# Patient Record
Sex: Female | Born: 1985 | Race: Black or African American | Hispanic: No | Marital: Married | Smoking: Never smoker
Health system: Southern US, Community
[De-identification: ages and names within clinical notes are randomized; demographics above are authoritative.]

## PROBLEM LIST (undated history)

## (undated) DIAGNOSIS — E0500 Thyrotoxicosis with diffuse goiter without thyrotoxic crisis or storm: Secondary | ICD-10-CM

## (undated) DIAGNOSIS — T7840XA Allergy, unspecified, initial encounter: Secondary | ICD-10-CM

## (undated) DIAGNOSIS — K219 Gastro-esophageal reflux disease without esophagitis: Secondary | ICD-10-CM

## (undated) HISTORY — DX: Allergy, unspecified, initial encounter: T78.40XA

## (undated) HISTORY — DX: Gastro-esophageal reflux disease without esophagitis: K21.9

---
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
## 2008-12-10 ENCOUNTER — Emergency Department (HOSPITAL_COMMUNITY): Admission: EM | Admit: 2008-12-10 | Discharge: 2008-12-10 | Payer: Self-pay | Admitting: Family Medicine

## 2009-02-02 ENCOUNTER — Emergency Department (HOSPITAL_COMMUNITY): Admission: EM | Admit: 2009-02-02 | Discharge: 2009-02-02 | Payer: Self-pay | Admitting: Emergency Medicine

## 2009-07-10 ENCOUNTER — Emergency Department (HOSPITAL_COMMUNITY): Admission: EM | Admit: 2009-07-10 | Discharge: 2009-07-10 | Payer: Self-pay | Admitting: Family Medicine

## 2010-07-07 ENCOUNTER — Ambulatory Visit (INDEPENDENT_AMBULATORY_CARE_PROVIDER_SITE_OTHER): Payer: PRIVATE HEALTH INSURANCE | Admitting: Physician Assistant

## 2010-07-07 ENCOUNTER — Other Ambulatory Visit (HOSPITAL_COMMUNITY)
Admission: RE | Admit: 2010-07-07 | Discharge: 2010-07-07 | Disposition: A | Discharge status: Home / Self Care | Payer: PRIVATE HEALTH INSURANCE | Source: Ambulatory Visit | Attending: Family Medicine | Admitting: Family Medicine

## 2010-07-07 ENCOUNTER — Other Ambulatory Visit: Payer: Self-pay | Admitting: Family Medicine

## 2010-07-07 DIAGNOSIS — Z124 Encounter for screening for malignant neoplasm of cervix: Secondary | ICD-10-CM | POA: Insufficient documentation

## 2010-07-07 DIAGNOSIS — B1710 Acute hepatitis C without hepatic coma: Secondary | ICD-10-CM

## 2010-07-07 DIAGNOSIS — Z0000 Encounter for general adult medical examination without abnormal findings: Secondary | ICD-10-CM

## 2010-07-07 DIAGNOSIS — J45909 Unspecified asthma, uncomplicated: Secondary | ICD-10-CM

## 2010-08-04 ENCOUNTER — Ambulatory Visit (INDEPENDENT_AMBULATORY_CARE_PROVIDER_SITE_OTHER): Payer: PRIVATE HEALTH INSURANCE | Admitting: Family Medicine

## 2010-08-04 DIAGNOSIS — J45909 Unspecified asthma, uncomplicated: Secondary | ICD-10-CM

## 2010-10-05 ENCOUNTER — Ambulatory Visit (INDEPENDENT_AMBULATORY_CARE_PROVIDER_SITE_OTHER): Payer: PRIVATE HEALTH INSURANCE | Admitting: Medical

## 2010-10-05 DIAGNOSIS — T7840XA Allergy, unspecified, initial encounter: Secondary | ICD-10-CM

## 2010-10-05 DIAGNOSIS — L509 Urticaria, unspecified: Secondary | ICD-10-CM

## 2010-10-12 ENCOUNTER — Ambulatory Visit (INDEPENDENT_AMBULATORY_CARE_PROVIDER_SITE_OTHER): Payer: PRIVATE HEALTH INSURANCE | Admitting: Medical

## 2010-10-12 ENCOUNTER — Encounter: Payer: Self-pay | Admitting: Medical

## 2010-10-12 VITALS — BP 120/88 | HR 88 | Temp 98.1°F | Ht 59.0 in | Wt 140.0 lb

## 2010-10-12 DIAGNOSIS — J45909 Unspecified asthma, uncomplicated: Secondary | ICD-10-CM

## 2010-10-12 DIAGNOSIS — L509 Urticaria, unspecified: Secondary | ICD-10-CM

## 2010-10-12 DIAGNOSIS — J40 Bronchitis, not specified as acute or chronic: Secondary | ICD-10-CM

## 2010-10-12 MED ORDER — LEVOCETIRIZINE DIHYDROCHLORIDE 5 MG PO TABS: 5.0000 mg | ORAL_TABLET | Freq: Every evening | ORAL | Status: AC

## 2010-10-12 MED ORDER — AZITHROMYCIN 500 MG PO TABS: 500.0000 mg | ORAL_TABLET | Freq: Every day | ORAL | Status: AC

## 2010-10-12 MED ORDER — FLUTICASONE PROPIONATE HFA 110 MCG/ACT IN AERO: 2.0000 | INHALATION_SPRAY | Freq: Two times a day (BID) | RESPIRATORY_TRACT | Status: DC

## 2010-10-12 MED ORDER — TRIAMCINOLONE ACETONIDE 0.1 % EX CREA: TOPICAL_CREAM | Freq: Two times a day (BID) | CUTANEOUS | Status: DC

## 2010-10-12 NOTE — Patient Instructions (Signed)
Hives Hives (urticaria) are itchy, red, swollen patches on the skin. They may change size, shape, and location quickly and repeatedly. Hives that occur deeper in the skin can cause swelling of the hands, feet, and face. Hives may be an allergic reaction to something you ate, touched, or put on your skin. Hives can also be a reaction to cold, heat, viral infections, medication, insect bites, or emotional stress. Often the cause is hard to find. Hives can come and go for several days to several weeks. Hives are not contagious. HOME CARE INSTRUCTIONS  If the cause of the hives is known, avoid exposure to that source.   To relieve itching and rash:   Apply cold compresses to the skin or take cool water baths. Do not take hot baths or showers because the warmth will make the itching worse.   The best medicine for hives is an antihistamine. An antihistamine will not cure hives, but it will reduce their severity. You can use an antihistamine available over the counter, such as Benadryl. This medicine may make you sleepy. You should not drive while using this medicine.   Other medications may be prescribed for itching. Use these as directed.   You should wear loose fitting clothing, including undergarments, as skin irritations may make hives worse.   Follow-up as directed by your caregiver.  SEEK MEDICAL CARE IF:  You still have considerable itching after taking the medication (prescribed or purchased over the counter).   An oral temperature above 101 develops.   Joint swelling or pain occurs.   You are not improving or are getting worse.  SEEK IMMEDIATE MEDICAL CARE IF:  You notice any swelling of the lips, tongue, face or neck.   You have shortness of breath, difficulty breathing or swallowing, or tightness in the throat or chest.   Belly (abdominal) pain develops.   You are feeling very sick.   Your hives continue to spread.  These may be the first signs of a life-threatening  allergic reaction. THIS IS AN EMERGENCY. Call 911 for medical help. Document Released: 06/29/2004 Document Re-Released: 08/16/2009 [facility] Patient Information 2011 [place], [place].

## 2010-10-12 NOTE — Progress Notes (Signed)
  Subjective:    Patient ID: Helen Cameron, female    DOB: [birth year], 25 y.o.   MRN: [id number]  HPI Here for recheck.  Saw me last week for rash/hives, was given IM Depo Medrol in house.  She has also been taking her Singulair, OTC Benadryl, topical hydrocortisone, but the rash comes and goes.  She notes having rash 3-4 days after shot.  No rash yesterday.  Overall feels much better than she was.  She also notes cough, congestion, productive sputum.  Lately having to use rescue inhaler 3-4 times per day.  Was on Flovent prior, months ago, but had stopped this.   No other c/o.   Review of Systems  Constitutional: Positive for fatigue. Negative for fever, chills and unexpected weight change.  HENT: Positive for congestion. Negative for ear pain, sore throat, rhinorrhea, sneezing, neck pain, sinus pressure and ear discharge.   Eyes: Negative for discharge and itching.  Respiratory: Positive for cough, chest tightness, shortness of breath and wheezing. Negative for stridor.   Cardiovascular: Negative for chest pain and leg swelling.  Gastrointestinal: Negative for nausea, vomiting, abdominal pain and diarrhea.  Genitourinary: Negative for dysuria.  Musculoskeletal: Negative for myalgias and arthralgias.  Skin: Positive for rash.       Objective:   Physical Exam  Constitutional: She appears well-developed and well-nourished. No distress.  HENT:  Head: Normocephalic.  Right Ear: Tympanic membrane normal.  Left Ear: Tympanic membrane normal.  Nose: Mucosal edema and rhinorrhea present. No sinus tenderness. Right sinus exhibits no maxillary sinus tenderness and no frontal sinus tenderness. Left sinus exhibits no maxillary sinus tenderness and no frontal sinus tenderness.  Mouth/Throat: Oropharynx is clear and moist and mucous membranes are normal. No posterior oropharyngeal erythema.  Eyes: Conjunctivae are normal. Right eye exhibits no discharge. Left eye exhibits no discharge.  Neck:  Neck supple. No thyromegaly present.  Cardiovascular: Normal rate, regular rhythm, normal heart sounds and intact distal pulses.   No murmur heard. Pulmonary/Chest: Effort normal. No stridor. She has no wheezes. She has no rales.       Somewhat bronchial sounding, decreased breath sounds  Musculoskeletal: She exhibits no edema.  Lymphadenopathy:    She has no cervical adenopathy.  Skin: Skin is warm and dry. Rash noted.       Few small areas of whealed lesions, left upper arm, right lateral arm, neck, otherwise much improved from last visit          Assessment & Plan:   Encounter Diagnoses  Name Primary?  . Bronchitis Yes  . Asthma   . Urticaria    Bronchitis - Zpak, rest, hydrate well, OTC Mucinex DM.  Call if not improving.  Asthma - c/t rescue inhaler prn, but we added back Flovent inhaler today.   Urticaria - avoid triggers, possibly the recent strawberries.  Hydroxyzine prescribed today,   Call report in 78mo.

## 2010-11-24 ENCOUNTER — Encounter: Payer: Self-pay | Admitting: Family Medicine

## 2010-11-24 ENCOUNTER — Ambulatory Visit (INDEPENDENT_AMBULATORY_CARE_PROVIDER_SITE_OTHER): Payer: PRIVATE HEALTH INSURANCE | Admitting: Family Medicine

## 2010-11-24 VITALS — BP 128/88 | HR 84 | Ht 59.0 in | Wt 141.0 lb

## 2010-11-24 DIAGNOSIS — J45909 Unspecified asthma, uncomplicated: Secondary | ICD-10-CM | POA: Insufficient documentation

## 2010-11-24 DIAGNOSIS — N390 Urinary tract infection, site not specified: Secondary | ICD-10-CM

## 2010-11-24 DIAGNOSIS — R109 Unspecified abdominal pain: Secondary | ICD-10-CM

## 2010-11-24 LAB — POCT URINALYSIS DIPSTICK
Bilirubin, UA: NEGATIVE
Blood, UA: NEGATIVE
Glucose, UA: NEGATIVE
Spec Grav, UA: 1.02

## 2010-11-24 NOTE — Progress Notes (Signed)
SUBJECTIVE:  Patient presents with lower abdominal pain (described as bladder pain) for about a week.  Yesterday the pain was worse, she drank a lot of cranberry juice, which seemed to help.  Pain is back today. Denies any dysuria.  +increase in urinary frequency, some urgency.    Denies any vaginal discharge, odor, itch (just skin after shaving). She is married, and monogamous.  Past Medical History  Diagnosis Date  . Allergy   . Asthma   . GERD (gastroesophageal reflux disease)     History reviewed. No pertinent past surgical history.  History   Social History  . Marital Status: Single    Spouse Name: N/A    Number of Children: N/A  . Years of Education: N/A   Occupational History  . Not on file.   Social History Main Topics  . Smoking status: Never Smoker   . Smokeless tobacco: Not on file  . Alcohol Use: Yes     few times a year, special occasions.  . Drug Use: No  . Sexually Active: Not on file   Other Topics Concern  . Not on file   Social History Narrative  . No narrative on file    Family History  Problem Relation Age of Onset  . Fibroids Sister   . Asthma Neg Hx   . Heart disease Neg Hx     Current outpatient prescriptions:albuterol (PROVENTIL,VENTOLIN) 90 MCG/ACT inhaler, Inhale 2 puffs into the lungs every 6 (six) hours as needed.  , Disp: , Rfl: ;  fluticasone (FLOVENT HFA) 110 MCG/ACT inhaler, Inhale 2 puffs into the lungs 2 (two) times daily., Disp: 1 Inhaler, Rfl: 3;  levocetirizine (XYZAL) 5 MG tablet, Take 1 tablet (5 mg total) by mouth every evening., Disp: 30 tablet, Rfl: 5 montelukast (SINGULAIR) 10 MG tablet, Take 10 mg by mouth at bedtime.  , Disp: , Rfl: ;  triamcinolone (KENALOG) 0.1 % cream, Apply topically 2 (two) times daily., Disp: 30 g, Rfl: 0  Allergies  Allergen Reactions  . Omeprazole     REACTION: Rash   ROS: Asthma has been well controlled, no active allergies.  No fever, nausea, vomiting, diarrhea, vaginal discharge.  See  HPI  PHYSICAL EXAM: BP 128/88  Pulse 84  Ht 4\' 11"  (1.499 m)  Wt 141 lb (63.957 kg)  BMI 28.48 kg/m2  LMP 11/04/2010 Well developed, pleasant overweight female in no distress Back: no CVA tenderness Abdomen:  Mild suprapubic tenderness, slightly tender in both L and R lower quadrants.  No rebound tenderness or guarding Pelvic exam:  Normal external genitalia.  Tender at R adnexa, no mass.  L adnexa nontender.  No cervical motion tenderness, uterus normal  Urine dipstick --entirely normal  ASSESSMENT/PLAN:  1. Urinary tract infection, site not specified  POCT urinalysis dipstick, Urine Culture  2. Abdominal  pain, other specified site    3. Abdominal pain  POCT urine pregnancy   No evidence of UTI per u/a.  Will send for culture to ensure no infection.  Pregnancy test was negative, but she is not late on her cycle yet.  Given tenderness of R adnexa, most likely diagnosis is ovarian cyst.  Recommend use of NSAIDS (ie ibuprofen 600-800mg  TID with food prn pain).  F/u if increasing pain, fever, vaginal discharge, persistent or worsening symptoms.  May have some component of constipation--encouraged adequate fluid and fiber intake.  Asthma--reminded to restart Singulair once allergies/wheezes recur. Continue Flovent

## 2010-11-24 NOTE — Patient Instructions (Addendum)
I SUSPECT YOUR PAIN IS FROM A POSSIBLE OVARIAN CYST.  ANTI-INFLAMMATORIES SHOULD HELP WITH THE DISCOMFORT.  PLEASE SEEK MEDICAL CARE IF YOU DEVELOP FEVERS, WORSENING PELVIC PAIN, OR VAGINAL DISCHARGE.  FOLLOW UP IF SYMPTOMS PERSIST OR WORSEN.  WE ARE SENDING YOUR URINE FOR CULTURE AND WILL NOTIFY YOU IF YOU HAVE AN INFECTION    Ovarian Cyst The ovaries are small organs that are on each side of the uterus. The ovaries are the organs that produce the female hormones, estrogen and progesterone. An ovarian cyst is a sac filled with fluid that can vary in its size. It is normal for a small cyst to form in women who are in the childbearing age and who have menstrual periods. This type of cyst is called a follicle cyst that becomes an ovulation cyst (corpus luteum cyst) after it produces the women's egg. It later goes away on its own if the woman does not become pregnant. There are other kinds of ovarian cysts that may cause problems and may need to be treated. The most serious problem is a cyst with cancer. It should be noted that menopausal women who have an ovarian cyst are at a higher risk of it being a cancer cyst. They should be evaluated very quickly, thoroughly and followed closely. This is especially true in menopausal women because of the high rate of ovarian cancer in women in menopause. CAUSES AND TYPES OF OVARIAN CYSTS:  FUNCTIONAL CYST: The follicle/corpus luteum cyst is a functional cyst that occurs every month during ovulation with the menstrual cycle. They go away with the next menstrual cycle if the woman does not get pregnant. Usually, there are no symptoms with a functional cyst.   ENDOMETRIOMA CYST: This cyst develops from the lining of the uterus tissue. This cyst gets in or on the ovary. It grows every month from the bleeding during the menstrual period. It is also called a "chocolate cyst" because it becomes filled with blood that turns brown. This cyst can cause pain in the lower abdomen  during intercourse and with your menstrual period.   CYSTADENOMA CYST: This cyst develops from the cells on the outside of the ovary. They usually are not cancerous. They can get very big and cause lower abdomen pain and pain with intercourse. This type of cyst can twist on itself, cut off its blood supply and cause severe pain. It also can easily rupture and cause a lot of pain.   DERMOID CYST: This type of cyst is sometimes found in both ovaries. They are found to have different kinds of body tissue in the cyst. The tissue includes skin, teeth, hair, and/or cartilage. They usually do not have symptoms unless they get very big. Dermoid cysts are rarely cancerous.   POLYCYSTIC OVARY: This is a rare condition with hormone problems that produces many small cysts on both ovaries. The cysts are follicle-like cysts that never produce an egg and become a corpus luteum. It can cause an increase in body weight, infertility, acne, increase in body and facial hair and lack of menstrual periods or rare menstrual periods. Many women with this problem develop type 2 diabetes. The exact cause of this problem is unknown. A polycystic ovary is rarely cancerous.   THECA LUTEIN CYST: Occurs when too much hormone (human chorionic gonadotropin) is produced and over-stimulates the ovaries to produce an egg. They are frequently seen when doctors stimulate the ovaries for invitro-fertilization (test tube babies).   LUTEOMA CYST: This cyst is seen during  pregnancy. Rarely it can cause an obstruction to the birth canal during labor and delivery. They usually go away after delivery.  SYMPTOMS  Pelvic pain or pressure.   Pain during sexual intercourse.   Increasing girth (swelling) of the abdomen.   Abnormal menstrual periods.   Increasing pain with menstrual periods.   You stop having menstrual periods and you are not pregnant.  DIAGNOSIS The diagnosis can be made during:  Routine or annual pelvic examination  (common).   Ultrasound.   X-ray of the pelvis.   CT Scan.   MRI.   Blood tests.  TREATMENT  Treatment may only be to follow the cyst monthly for 2 to 3 months with your caregiver. Many go away on their own, especially functional cysts.   May be aspirated (drained) with a long needle with ultrasound, or by laparoscopy (inserting a tube into the pelvis through a small incision).   The whole cyst can be removed by laparoscopy.   Sometimes the cyst may need to be removed through an incision in the lower abdomen.   Hormone treatment is sometimes used to help dissolve certain cysts.   Birth control pills are sometimes used to help dissolve certain cysts.  HOME CARE INSTRUCTIONS Follow your caregiver's advice regarding:  Medicine.   Follow up visits to evaluate and treat the cyst.   You may need to come back or make an appointment with another caregiver, to find the exact cause of your cyst, if your caregiver is not a gynecologist.   Get your yearly and recommended pelvic examinations and Pap tests.   Let your caregiver know if you have had an ovarian cyst in the past.  SEEK MEDICAL CARE IF:  Your periods are late, irregular, they stop, or are painful.   Your stomach (abdomen) or pelvic pain does not go away.   Your stomach becomes larger or swollen.   You have pressure on your bladder or trouble emptying your bladder completely.   You have painful sexual intercourse.   You have feelings of fullness, pressure, or discomfort in your stomach.   You lose weight for no apparent reason.   You feel generally ill.   You become constipated.   You lose your appetite.   You develop acne.   You have an increase in body and facial hair.   You are gaining weight, without changing your exercise and eating habits.   You think you are pregnant.  SEEK IMMEDIATE MEDICAL CARE IF:  You have increasing abdominal pain.   You feel sick to your stomach (nausea) and/or vomit.    You develop a fever that comes on suddenly.   You develop abdominal pain during a bowel movement.   Your menstrual periods become heavier than usual.  Document Released: 05/22/2005 Document Re-Released: 05/10/2009 [facility] Patient Information 2011 [place], [place].

## 2010-11-26 LAB — URINE CULTURE: Colony Count: >=100000

## 2010-11-28 ENCOUNTER — Telehealth: Payer: Self-pay | Admitting: *Deleted

## 2010-11-28 NOTE — Telephone Encounter (Signed)
Spoke with patient, she stated she is feeling better than when she came in to see you. She did, however just start her cycle. She stated that once her cycle stopped if she was still having any symptoms she would come in for a lab visit(clean catch UA and culture).

## 2011-02-02 ENCOUNTER — Ambulatory Visit: Payer: PRIVATE HEALTH INSURANCE | Admitting: Family Medicine

## 2011-02-21 ENCOUNTER — Ambulatory Visit (INDEPENDENT_AMBULATORY_CARE_PROVIDER_SITE_OTHER)
Admission: RE | Admit: 2011-02-21 | Discharge: 2011-02-21 | Disposition: A | Discharge status: Home / Self Care | Payer: BC Managed Care – PPO | Source: Ambulatory Visit | Attending: Emergency Medicine | Admitting: Emergency Medicine

## 2011-02-21 ENCOUNTER — Other Ambulatory Visit: Payer: Self-pay

## 2011-02-21 ENCOUNTER — Emergency Department (HOSPITAL_BASED_OUTPATIENT_CLINIC_OR_DEPARTMENT_OTHER)
Admission: EM | Admit: 2011-02-21 | Discharge: 2011-02-21 | Disposition: A | Discharge status: Home / Self Care | Payer: BC Managed Care – PPO | Attending: Emergency Medicine | Admitting: Emergency Medicine

## 2011-02-21 DIAGNOSIS — R0609 Other forms of dyspnea: Secondary | ICD-10-CM | POA: Insufficient documentation

## 2011-02-21 DIAGNOSIS — R079 Chest pain, unspecified: Secondary | ICD-10-CM

## 2011-02-21 DIAGNOSIS — R0989 Other specified symptoms and signs involving the circulatory and respiratory systems: Secondary | ICD-10-CM | POA: Insufficient documentation

## 2011-02-21 DIAGNOSIS — J45909 Unspecified asthma, uncomplicated: Secondary | ICD-10-CM | POA: Insufficient documentation

## 2011-02-21 MED ORDER — ACETAMINOPHEN 325 MG PO TABS: 650.0000 mg | ORAL_TABLET | Freq: Once | ORAL | Status: DC

## 2011-02-21 NOTE — ED Notes (Signed)
Pt states she will take Tylenol when she gets home. Medication not given here prior to d/c

## 2011-02-22 ENCOUNTER — Encounter: Payer: Self-pay | Admitting: Family Medicine

## 2011-02-22 ENCOUNTER — Ambulatory Visit (INDEPENDENT_AMBULATORY_CARE_PROVIDER_SITE_OTHER): Payer: BC Managed Care – PPO | Admitting: Family Medicine

## 2011-02-22 ENCOUNTER — Ambulatory Visit: Payer: BC Managed Care – PPO | Admitting: Family Medicine

## 2011-02-22 VITALS — BP 132/78 | HR 72 | Ht 59.0 in | Wt 144.0 lb

## 2011-02-22 DIAGNOSIS — K219 Gastro-esophageal reflux disease without esophagitis: Secondary | ICD-10-CM

## 2011-02-22 DIAGNOSIS — Z23 Encounter for immunization: Secondary | ICD-10-CM

## 2011-02-22 DIAGNOSIS — J45909 Unspecified asthma, uncomplicated: Secondary | ICD-10-CM

## 2011-02-22 MED ORDER — DEXLANSOPRAZOLE 60 MG PO CPDR: 60.0000 mg | DELAYED_RELEASE_CAPSULE | Freq: Every day | ORAL | Status: DC

## 2011-02-22 MED ORDER — MONTELUKAST SODIUM 10 MG PO TABS
10.0000 mg | ORAL_TABLET | Freq: Every day | ORAL | Status: DC
Start: 2011-02-22 — End: 2012-01-03

## 2011-02-22 NOTE — Progress Notes (Signed)
Went to ER yesterday with chest pain.  Had EKG, bloodwork and chest x-ray and was told everything was normal.  She was told she had reflux and to f/u with her primary doctor.  Was not given any prescription medication. (Computers were down--no records available). She has a h/o reflux in the past  Has been having problems with acid reflux for about a year.  Chest pain started about a week ago. Has been drinking more milk and taking Tums, and the milk seems to help with her reflux some.  Has been having some spicy foods, and has been drinking tea daily for the last 2 weeks.  Hasn't had alcohol recently.  Denies eating citrus foods, but has had some red sauces.  Allergy to prilosec, causes rash  Past Medical History  Diagnosis Date  . Allergy   . Asthma   . GERD (gastroesophageal reflux disease)     History reviewed. No pertinent past surgical history.  History   Social History  . Marital Status: Single    Spouse Name: N/A    Number of Children: N/A  . Years of Education: N/A   Occupational History  . student at [employer]    Social History Main Topics  . Smoking status: Never Smoker   . Smokeless tobacco: Not on file  . Alcohol Use: Yes     few times a year, special occasions.  . Drug Use: No  . Sexually Active: Not on file   Other Topics Concern  . Not on file   Social History Narrative  . No narrative on file    Family History  Problem Relation Age of Onset  . Fibroids Sister   . Asthma Neg Hx   . Heart disease Neg Hx     Current outpatient prescriptions:albuterol (PROVENTIL,VENTOLIN) 90 MCG/ACT inhaler, Inhale 2 puffs into the lungs every 6 (six) hours as needed.  , Disp: , Rfl: ;  fluticasone (FLOVENT HFA) 110 MCG/ACT inhaler, Inhale 2 puffs into the lungs 2 (two) times daily., Disp: 1 Inhaler, Rfl: 3;  montelukast (SINGULAIR) 10 MG tablet, Take 1 tablet (10 mg total) by mouth at bedtime., Disp: 30 tablet, Rfl: 5 dexlansoprazole (DEXILANT) 60 MG capsule, Take 1 capsule (60  mg total) by mouth daily., Disp: 20 capsule, Rfl: 0;  levocetirizine (XYZAL) 5 MG tablet, Take 1 tablet (5 mg total) by mouth every evening., Disp: 30 tablet, Rfl: 5;  triamcinolone (KENALOG) 0.1 % cream, Apply topically 2 (two) times daily., Disp: 30 g, Rfl: 0  Allergies  Allergen Reactions  . Omeprazole     REACTION: Rash   ROS: Denies fevers, URI symptoms, SOB, stool changes.  +abdominal pain, heartburn  PHYSICAL EXAM: BP 132/78  Pulse 72  Ht 4\' 11"  (1.499 m)  Wt 144 lb (65.318 kg)  BMI 29.08 kg/m2  LMP 02/12/2011 Well developed, pleasant female in no distress Neck: no lymphadenopathy or mass Heart: regular rate and rhythm without murmur Lungs clear bilaterally Abdomen: +Epigastric tenderness, diffuse lower abdominal tenderness.  No organomegaly, rebound tenderness or guarding, normal bowel sounds Extremities: no edema Skin: no rash  ASSESSMENT/PLAN: 1. GERD (gastroesophageal reflux disease)  dexlansoprazole (DEXILANT) 60 MG capsule  2. Need for prophylactic vaccination and inoculation against influenza  Flu vaccine greater than or equal to 3yo preservative free IM  3. Asthma  montelukast (SINGULAIR) 10 MG tablet   GERD--reflux precautions reviewed.  Samples of Dexilant (h/o rash to prilosec-if rash develops then stop immediately).  Otherwise take for 2-3 weeks, change diet,  and consider changing over to OTC Pepcid or Zantac.  If these aren't effective, then call for rx of PPI  Asthma--doing well.  Refilled Singulair and flu shot given

## 2011-02-22 NOTE — Patient Instructions (Signed)
Esophagitis (Heartburn) Esophagitis (heartburn) is a painful, burning sensation in the chest. It may feel worse in certain positions, such as lying down or bending over. It is caused by stomach acid backing up into the tube that carries food from the mouth down to the stomach (lower esophagus). TREATMENT There are a number of non-prescription medicines used to treat heartburn, including:  Antacids.   Acid reducers (also called H-2 blockers).   Proton-pump inhibitors.  HOME CARE INSTRUCTIONS  Raise the head of your bead by putting blocks under the legs.   Eat 2-3 hours before going to bed.   Stop smoking.   Try to reach and maintain a healthy weight.   Do not eat just a few very large meals. Instead, eat many smaller meals throughout the day.   Try to identify foods and beverages that make your symptoms worse, and avoid these.   Avoid tight clothing.   Do not exercise right after eating.  SEEK IMMEDIATE MEDICAL CARE IF YOU:  Have severe chest pain that goes down your arm, or into your jaw or neck.   Feel sweaty, dizzy, or lightheaded.   Are short of breath.   Throw up (vomit) blood.   Have difficulty or pain with swallowing.   Have bloody or black, tarry stools.   Have bouts of heartburn more than three times a week for more than two weeks.  Document Released: 06/29/2004 Document Re-Released: 08/16/2009 [employer] Patient Information 2011 [employer]  YOU MAY TRY GAS-X FOR THE LOWER ABDOMINAL PAINS YOU ARE HAVING ONCE YOU HAVE COMPLETED THE SAMPLES GIVEN, YOU CAN TRY CHANGING TO OVER-THE-COUNTER ZANTAC OR PEPCID.  IF THESE AREN'T AS EFFECTIVE AS THE SAMPLES, THEN CALL FOR PRESCRIPTION OF THE SAMPLE MEDICATION  F/U 3 WEEKS, SOONER IF GETTING WORSE

## 2011-03-01 NOTE — ED Provider Notes (Signed)
History     CSN: [id number] Arrival date & time: 02/21/2011 11:43 AM  No chief complaint on file.   (Consider location/radiation/quality/duration/timing/severity/associated sxs/prior treatment) Patient is a 25 y.o. female presenting with chest pain. The history is provided by the patient. No language interpreter was used.  Chest Pain The chest pain began more than 2 weeks ago. Chest pain occurs constantly. The chest pain is unchanged. The pain is associated with breathing and coughing. At its most intense, the pain is at 6/10. The pain is currently at 3/10. The quality of the pain is described as aching and tightness. The pain does not radiate. Chest pain is worsened by deep breathing. Primary symptoms include shortness of breath and cough. She tried NSAIDs for the symptoms.    Chest Pain  The current episode started more than 2 weeks ago. The problem occurs constantly. The problem has been unchanged. The quality of the pain is described as aching and tightness. The pain does not radiate. Associated symptoms include a cough and shortness of breath. The pain is aggravated by breathing and coughing. She has tried NSAIDs for the symptoms.    Past Medical History  Diagnosis Date  . Allergy   . Asthma   . GERD (gastroesophageal reflux disease)     No past surgical history on file.  Family History  Problem Relation Age of Onset  . Fibroids Sister   . Asthma Neg Hx   . Heart disease Neg Hx     History  Substance Use Topics  . Smoking status: Never Smoker   . Smokeless tobacco: Not on file  . Alcohol Use: Yes     few times a year, special occasions.    OB History    Grav Para Term Preterm Abortions TAB SAB Ect Mult Living                  Review of Systems  Constitutional: Negative.   HENT: Negative.   Eyes: Negative.   Respiratory: Positive for cough and shortness of breath.   Cardiovascular: Positive for chest pain.  Gastrointestinal: Negative.   Genitourinary:  Negative.   Musculoskeletal: Positive for myalgias.  Skin: Negative.   Neurological: Negative.   Hematological: Negative.   Psychiatric/Behavioral: Negative.   All other systems reviewed and are negative.    Allergies  Omeprazole  Home Medications   Current Outpatient Rx  Name Route Sig Dispense Refill  . ALBUTEROL 90 MCG/ACT IN AERS Inhalation Inhale 2 puffs into the lungs every 6 (six) hours as needed.      . DEXLANSOPRAZOLE 60 MG PO CPDR Oral Take 1 capsule (60 mg total) by mouth daily. 20 capsule 0  . FLUTICASONE PROPIONATE  HFA 110 MCG/ACT IN AERO Inhalation Inhale 2 puffs into the lungs 2 (two) times daily. 1 Inhaler 3  . LEVOCETIRIZINE DIHYDROCHLORIDE 5 MG PO TABS Oral Take 1 tablet (5 mg total) by mouth every evening. 30 tablet 5  . MONTELUKAST SODIUM 10 MG PO TABS Oral Take 1 tablet (10 mg total) by mouth at bedtime. 30 tablet 5  . TRIAMCINOLONE ACETONIDE 0.1 % EX CREA Topical Apply topically 2 (two) times daily. 30 g 0    There were no vitals taken for this visit.  Physical Exam  Nursing note and vitals reviewed. Constitutional: She is oriented to person, place, and time. She appears well-developed and well-nourished. No distress.  HENT:  Head: Normocephalic and atraumatic.  Eyes: Conjunctivae and EOM are normal. Pupils are equal, round, and reactive  to light.  Neck: Normal range of motion.  Cardiovascular: Normal rate, regular rhythm, normal heart sounds and intact distal pulses.  Exam reveals no gallop and no friction rub.   No murmur heard. Pulmonary/Chest: Effort normal and breath sounds normal. No respiratory distress. She has no wheezes. She has no rales.  Abdominal: Soft. Bowel sounds are normal. She exhibits no distension. There is no tenderness. There is no rebound and no guarding.  Musculoskeletal: Normal range of motion. She exhibits no edema and no tenderness.  Neurological: She is alert and oriented to person, place, and time. No cranial nerve deficit.  She exhibits normal muscle tone. Coordination normal.  Skin: Skin is warm and dry.  Psychiatric: She has a normal mood and affect.    ED Course  Procedures (including critical care time)   Labs Reviewed  D-DIMER, QUANTITATIVE   No results found.   1. Chest pain, unspecified      Date: 02/21/2011  Rate: 67  Rhythm: normal sinus rhythm  QRS Axis: normal  Intervals: normal  ST/T Wave abnormalities: normal  Conduction Disutrbances:none  Narrative Interpretation:   Old EKG Reviewed: none available   MDM  Patient was admitted with greater than 2 weeks of chest pain.  She was hemodynamically stable but did have worsening of symptoms with deep breathing.  She had a normal CXR and ECG as well as d-dimer.  PAtient was treated symptomatically and discharged home in good condition.  A: 25 yo F with CP.  P: As per MDM above.       Cyndra Numbers, MD 03/10/11 [phone]

## 2011-03-13 ENCOUNTER — Encounter: Payer: Self-pay | Admitting: Family Medicine

## 2011-03-15 ENCOUNTER — Ambulatory Visit (INDEPENDENT_AMBULATORY_CARE_PROVIDER_SITE_OTHER): Payer: BC Managed Care – PPO | Admitting: Family Medicine

## 2011-03-15 ENCOUNTER — Telehealth: Payer: Self-pay | Admitting: *Deleted

## 2011-03-15 DIAGNOSIS — K219 Gastro-esophageal reflux disease without esophagitis: Secondary | ICD-10-CM

## 2011-03-15 DIAGNOSIS — J45909 Unspecified asthma, uncomplicated: Secondary | ICD-10-CM

## 2011-03-15 MED ORDER — DEXLANSOPRAZOLE 60 MG PO CPDR: 60.0000 mg | DELAYED_RELEASE_CAPSULE | Freq: Every day | ORAL | Status: AC

## 2011-03-15 NOTE — Telephone Encounter (Signed)
Called in Dexilant 60mg  #30 once daily with 2RF to [facility], [address]. Let pt know if rx was too expensive, even with card to take OTC Prevacid instead of Prilosec, as she has an allergy to Prilosec.

## 2011-03-15 NOTE — Telephone Encounter (Signed)
Patient has a follow up today, called and said that she cannot make it. She said that the Dexilant is working well and wants to know if you will refill medication if she doesn't come in today. Thanks.

## 2011-03-15 NOTE — Telephone Encounter (Signed)
Glad medication is working.  Not sure if insurance will cover it--okay to send Rx to pharmacy (I may have already given her a copay card).  If not covered by insurance, change to over-the-counter Prilosec instead

## 2011-03-30 ENCOUNTER — Telehealth: Payer: Self-pay | Admitting: Family Medicine

## 2011-03-30 NOTE — Telephone Encounter (Signed)
FAXED PHARMACY.

## 2011-03-30 NOTE — Telephone Encounter (Signed)
DONE

## 2011-05-01 ENCOUNTER — Telehealth: Payer: Self-pay | Admitting: Family Medicine

## 2011-05-01 MED ORDER — ALBUTEROL 90 MCG/ACT IN AERS: 2.0000 | INHALATION_SPRAY | Freq: Four times a day (QID) | RESPIRATORY_TRACT | Status: DC | PRN

## 2011-05-01 MED ORDER — FLUTICASONE PROPIONATE HFA 110 MCG/ACT IN AERO: 2.0000 | INHALATION_SPRAY | Freq: Two times a day (BID) | RESPIRATORY_TRACT | Status: DC

## 2011-05-02 NOTE — Telephone Encounter (Signed)
DONE

## 2011-07-24 ENCOUNTER — Encounter: Payer: Self-pay | Admitting: Family Medicine

## 2011-07-24 ENCOUNTER — Ambulatory Visit (INDEPENDENT_AMBULATORY_CARE_PROVIDER_SITE_OTHER): Payer: BC Managed Care – PPO | Admitting: Family Medicine

## 2011-07-24 VITALS — BP 120/84 | HR 76 | Temp 98.4°F | Ht 59.0 in | Wt 135.0 lb

## 2011-07-24 DIAGNOSIS — K5289 Other specified noninfective gastroenteritis and colitis: Secondary | ICD-10-CM

## 2011-07-24 DIAGNOSIS — K529 Noninfective gastroenteritis and colitis, unspecified: Secondary | ICD-10-CM

## 2011-07-24 NOTE — Progress Notes (Signed)
Chief complaint: Diarrhea and vomiting. Last vomited on Saturday, still having diarrhea and abdominal cramping. 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  The dining room has even been closed.  Started feeling sick 5 days ago--had diarrhea and vomiting.  Finally tried to eat something 2 days ago, and vomited after eating.  Hasn't vomited since.  Tolerated food yesterday without vomiting.  This morning had some abdominal pain.  Drank some ginger ale.  Had loose stool this morning.  Last week her diarrhea was very watery, now is somewhat formed.  Never had blood in her stool.  Had some subjective fevers and chills, but didn't check with thermometer (4-5 days ago).    Denies recent antibiotics, travel, raw foods, undercooked foods, or spoiled foods.  +sick contacts--other staff at [facility], as well as the residents there.   Having a lot of nausea, whenever she even smells food. Nauseated today so she hasn't eaten much.  Gassy yesterday, after having had strawberry milk; also had spicy wings yesterday.  Yesterday took pepto bismol--made stools dark (which worried her), but helped some.  Past Medical History  Diagnosis Date  . Allergy   . Asthma   . GERD (gastroesophageal reflux disease)     History reviewed. No pertinent past surgical history.  History   Social History  . Marital Status: Single    Spouse Name: N/A    Number of Children: N/A  . Years of Education: N/A   Occupational History  . student at [employer]    Social History Main Topics  . Smoking status: Never Smoker   . Smokeless tobacco: Not on file  . Alcohol Use: Yes     taking shots every now and then to sleep.  . Drug Use: No  . Sexually Active: Not on file   Other Topics Concern  . Not on file   Social History Narrative  . No narrative on file    Family History  Problem Relation Age of Onset  . Fibroids Sister     . Asthma Neg Hx   . Heart disease Neg Hx     Current outpatient prescriptions:albuterol (PROVENTIL,VENTOLIN) 90 MCG/ACT inhaler, Inhale 2 puffs into the lungs every 6 (six) hours as needed., Disp: 17 g, Rfl: 0;  fluticasone (FLOVENT HFA) 110 MCG/ACT inhaler, Inhale 2 puffs into the lungs 2 (two) times daily., Disp: 1 Inhaler, Rfl: 2;  levocetirizine (XYZAL) 5 MG tablet, Take 1 tablet (5 mg total) by mouth every evening., Disp: 30 tablet, Rfl: 5 montelukast (SINGULAIR) 10 MG tablet, Take 1 tablet (10 mg total) by mouth at bedtime., Disp: 30 tablet, Rfl: 5;  Prenatal Multivit-Min-Fe-FA (PRE-NATAL PO), Take 1 tablet by mouth daily., Disp: , Rfl:   Allergies  Allergen Reactions  . Omeprazole     REACTION: Rash  . Prilosec (Omeprazole Magnesium)    ROS:  Denies headaches, dizziness, skin rash, urinary complaints.  Occasionally L knee pain.  Not exercising recently.  Denies mood problems  PHYSICAL EXAM: BP 120/84  Pulse 76  Temp(Src) 98.4 F (36.9 C) (Oral)  Ht 4\' 11"  (1.499 m)  Wt 135 lb (61.236 kg)  BMI 27.27 kg/m2  LMP 06/30/2011 Well developed, pleasant female in no distress HEENT:  Conjunctiva clear.  OP clear with moist mucus membranes Neck: no lympadenopathy Heart: regular rate and rhythm without murmur Lungs: clear bilaterally Abdomen: active bowel sounds.  Mild tenderness in epigastrium, and diffusely over lower abdomen.  No organomegaly.  No rebound tenderness or guarding, no masses Extremities: no clubbing, cyanosis or edema Skin: no rash Psych; normal mood, affect, hygiene and grooming  ASSESSMENT/PLAN:  1. Gastroenteritis, acute    most likely norovirus, given exposure at work    Continued supportive measures.  BRAT diet, avoid dairy for about a week.  Gradually advance diet, avoiding spicy, greasy foods, and dairy, until better.  You may continue to use Pepto Bismol or Imodium if needed.    Return if develops worsening abdominal pain, high fevers, blood in the  stool, ongoing vomiting, or other concerns

## 2011-07-24 NOTE — Patient Instructions (Signed)
Continued supportive measures.  BRAT diet, avoid dairy for about a week.  Gradually advance diet, avoiding spicy, greasy foods, and dairy, until better.  You may continue to use Pepto Bismol or Imodium if needed.    Return if develops worsening abdominal pain, high fevers, blood in the stool, ongoing vomiting, or other concerns  Diet for Diarrhea, Adult Having frequent, runny stools (diarrhea) has many causes. Diarrhea may be caused or worsened by food or drink. Diarrhea may be relieved by changing your diet. IF YOU ARE NOT TOLERATING SOLID FOODS:  Drink enough water and fluids to keep your urine clear or pale yellow.   Avoid sugary drinks and sodas as well as milk-based beverages.   Avoid beverages containing caffeine and alcohol.   You may try rehydrating beverages. You can make your own by following this recipe:    tsp table salt.    tsp baking soda.   ? tsp salt substitute (potassium chloride).   1 tbs + 1 tsp sugar.   1 qt water.  As your stools become more solid, you can start eating solid foods. Add foods one at a time. If a certain food causes your diarrhea to get worse, avoid that food and try other foods. A low fiber, low-fat, and lactose-free diet is recommended. Small, frequent meals may be better tolerated.  Starches  Allowed:  White, [place], and pita breads, plain rolls, buns, bagels. Plain muffins, matzo. Soda, saltine, or graham crackers. Pretzels, melba toast, zwieback. Cooked cereals made with water: cornmeal, farina, cream cereals. Dry cereals: refined corn, wheat, rice. Potatoes prepared any way without skins, refined macaroni, spaghetti, noodles, refined rice.   Avoid:  Bread, rolls, or crackers made with whole wheat, multi-grains, rye, bran seeds, nuts, or coconut. Corn tortillas or taco shells. Cereals containing whole grains, multi-grains, bran, coconut, nuts, or raisins. Cooked or dry oatmeal. Coarse wheat cereals, granola. Cereals advertised as "high-fiber."  Potato skins. Whole grain pasta, wild or brown rice. Popcorn. Sweet potatoes/yams. Sweet rolls, doughnuts, waffles, pancakes, sweet breads.  Vegetables  Allowed: Strained tomato and vegetable juices. Most well-cooked and canned vegetables without seeds. Fresh: Tender lettuce, cucumber without the skin, cabbage, spinach, bean sprouts.   Avoid: Fresh, cooked, or canned: Artichokes, baked beans, beet greens, broccoli, Brussels sprouts, corn, kale, legumes, peas, sweet potatoes. Cooked: Green or red cabbage, spinach. Avoid large servings of any vegetables, because vegetables shrink when cooked, and they contain more fiber per serving than fresh vegetables.  Fruit  Allowed: All fruit juices except prune juice. Cooked or canned: Apricots, applesauce, cantaloupe, cherries, fruit cocktail, grapefruit, grapes, kiwi, mandarin oranges, peaches, pears, plums, watermelon. Fresh: Apples without skin, ripe banana, grapes, cantaloupe, cherries, grapefruit, peaches, oranges, plums. Keep servings limited to  cup or 1 piece.   Avoid: Fresh: Apple with skin, apricots, mango, pears, raspberries, strawberries. Prune juice, stewed or dried prunes. Dried fruits, raisins, dates. Large servings of all fresh fruits.  Meat and Meat Substitutes  Allowed: Ground or well-cooked tender beef, ham, veal, lamb, pork, or poultry. Eggs, plain cheese. Fish, oysters, shrimp, lobster, other seafoods. Liver, organ meats.   Avoid: Tough, fibrous meats with gristle. Peanut butter, smooth or chunky. Cheese, nuts, seeds, legumes, dried peas, beans, lentils.  Milk  Allowed: Yogurt, lactose-free milk, kefir, drinkable yogurt, buttermilk, soy milk.   Avoid: Milk, chocolate milk, beverages made with milk, such as milk shakes.  Soups  Allowed: Bouillon, broth, or soups made from allowed foods. Any strained soup.   Avoid: Soups made from  vegetables that are not allowed, cream or milk-based soups.  Desserts and Sweets  Allowed: Sugar-free  gelatin, sugar-free frozen ice pops made without sugar alcohol.   Avoid: Plain cakes and cookies, pie made with allowed fruit, pudding, custard, cream pie. Gelatin, fruit, ice, sherbet, frozen ice pops. Ice cream, ice milk without nuts. Plain hard candy, honey, jelly, molasses, syrup, sugar, chocolate syrup, gumdrops, marshmallows.  Fats and Oils  Allowed: Avoid any fats and oils.   Avoid: Seeds, nuts, olives, avocados. Margarine, butter, cream, mayonnaise, salad oils, plain salad dressings made from allowed foods. Plain gravy, crisp bacon without rind.  Beverages  Allowed: Water, decaffeinated teas, oral rehydration solutions, sugar-free beverages.   Avoid: Fruit juices, caffeinated beverages (coffee, tea, soda or pop), alcohol, sports drinks, or lemon-lime soda or pop.  Condiments  Allowed: Ketchup, mustard, horseradish, vinegar, cream sauce, cheese sauce, cocoa powder. Spices in moderation: allspice, basil, bay leaves, celery powder or leaves, cinnamon, cumin powder, curry powder, ginger, mace, marjoram, onion or garlic powder, oregano, paprika, parsley flakes, ground pepper, rosemary, sage, savory, tarragon, thyme, turmeric.   Avoid: Coconut, honey.  Weight Monitoring: Weigh yourself every day. You should weigh yourself in the morning after you urinate and before you eat breakfast. Wear the same amount of clothing when you weigh yourself. Record your weight daily. Bring your recorded weights to your clinic visits. Tell your caregiver right away if you have gained 3 lb/1.4 kg or more in 1 day, 5 lb/2.3 kg in a week, or whatever amount you were told to report. SEEK IMMEDIATE MEDICAL CARE IF:   You are unable to keep fluids down.   You start to throw up (vomit) or diarrhea keeps coming back (persistent).   Abdominal pain develops, increases, or can be felt in one place (localizes).   You have an oral temperature above 102 F (38.9 C), not controlled by medicine.   Diarrhea contains  blood or mucus.   You develop excessive weakness, dizziness, fainting, or extreme thirst.  MAKE SURE YOU:   Understand these instructions.   Will watch your condition.   Will get help right away if you are not doing well or get worse.  Document Released: 08/12/2003 Document Revised: 02/01/2011 Document Reviewed: 12/03/2008 [facility] Patient Information 2012 [place], [place].

## 2011-07-31 ENCOUNTER — Other Ambulatory Visit: Payer: Self-pay | Admitting: Obstetrics and Gynecology

## 2011-07-31 DIAGNOSIS — N979 Female infertility, unspecified: Secondary | ICD-10-CM

## 2011-08-02 ENCOUNTER — Ambulatory Visit (HOSPITAL_COMMUNITY): Admission: RE | Admit: 2011-08-02 | Payer: BC Managed Care – PPO | Source: Ambulatory Visit

## 2011-08-25 ENCOUNTER — Telehealth: Payer: Self-pay | Admitting: Medical

## 2011-08-25 MED ORDER — ALBUTEROL SULFATE HFA 108 (90 BASE) MCG/ACT IN AERS: 2.0000 | INHALATION_SPRAY | Freq: Four times a day (QID) | RESPIRATORY_TRACT | Status: DC | PRN

## 2011-08-25 NOTE — Telephone Encounter (Signed)
Helen Cameron called in med

## 2011-10-13 ENCOUNTER — Other Ambulatory Visit: Payer: Self-pay | Admitting: Medical

## 2011-10-13 ENCOUNTER — Telehealth: Payer: Self-pay | Admitting: Internal Medicine

## 2011-10-13 MED ORDER — ALBUTEROL SULFATE HFA 108 (90 BASE) MCG/ACT IN AERS: 2.0000 | INHALATION_SPRAY | Freq: Four times a day (QID) | RESPIRATORY_TRACT | Status: DC | PRN

## 2011-10-13 MED ORDER — FLUTICASONE PROPIONATE HFA 110 MCG/ACT IN AERO: 2.0000 | INHALATION_SPRAY | Freq: Two times a day (BID) | RESPIRATORY_TRACT | Status: DC

## 2011-10-13 NOTE — Telephone Encounter (Signed)
PATIENTS HUSBAND WAS NOTIFIED OF HER MEDICATION SENT TO THE PHARMACY AND THAT SHE NEEDS TO MAKE AN APPOINTMENT TO DISCUSS ASTHMA. CLS

## 2011-10-13 NOTE — Telephone Encounter (Signed)
PATIENTS HUSBAND STATES THAT HIS WIFE USES THE INHALER EVERYDAY AND UP TO 2 TO 3 TIMES A DAY. CLS

## 2011-10-13 NOTE — Telephone Encounter (Signed)
How often is she using albuterol inhaler?

## 2011-10-13 NOTE — Telephone Encounter (Signed)
Refill sent, but she needs OV to discussed better asthma control, other medications.

## 2011-10-22 MED ORDER — ALBUTEROL SULFATE HFA 108 (90 BASE) MCG/ACT IN AERS: 2.0000 | INHALATION_SPRAY | Freq: Four times a day (QID) | RESPIRATORY_TRACT | Status: DC | PRN

## 2011-10-22 NOTE — Progress Notes (Signed)
  Subjective:    Patient ID: Helen Cameron, female    DOB: 02/14/1986, 26 y.o.   MRN: [id number]  HPI    Review of Systems     Objective:   Physical Exam        Assessment & Plan:  She ran out of her inhaler.Proventil HFA was called in.

## 2011-11-06 ENCOUNTER — Ambulatory Visit: Payer: BC Managed Care – PPO | Admitting: Medical

## 2011-12-17 ENCOUNTER — Emergency Department (HOSPITAL_BASED_OUTPATIENT_CLINIC_OR_DEPARTMENT_OTHER)
Admission: EM | Admit: 2011-12-17 | Discharge: 2011-12-17 | Disposition: A | Discharge status: Home / Self Care | Payer: No Typology Code available for payment source | Attending: Emergency Medicine | Admitting: Emergency Medicine

## 2011-12-17 ENCOUNTER — Emergency Department (HOSPITAL_BASED_OUTPATIENT_CLINIC_OR_DEPARTMENT_OTHER): Payer: No Typology Code available for payment source

## 2011-12-17 ENCOUNTER — Encounter (HOSPITAL_BASED_OUTPATIENT_CLINIC_OR_DEPARTMENT_OTHER): Payer: Self-pay | Admitting: *Deleted

## 2011-12-17 DIAGNOSIS — Y92410 Unspecified street and highway as the place of occurrence of the external cause: Secondary | ICD-10-CM | POA: Insufficient documentation

## 2011-12-17 DIAGNOSIS — S139XXA Sprain of joints and ligaments of unspecified parts of neck, initial encounter: Secondary | ICD-10-CM | POA: Insufficient documentation

## 2011-12-17 DIAGNOSIS — K219 Gastro-esophageal reflux disease without esophagitis: Secondary | ICD-10-CM | POA: Insufficient documentation

## 2011-12-17 DIAGNOSIS — M542 Cervicalgia: Secondary | ICD-10-CM | POA: Insufficient documentation

## 2011-12-17 DIAGNOSIS — J45909 Unspecified asthma, uncomplicated: Secondary | ICD-10-CM | POA: Insufficient documentation

## 2011-12-17 DIAGNOSIS — S161XXA Strain of muscle, fascia and tendon at neck level, initial encounter: Secondary | ICD-10-CM

## 2011-12-17 LAB — PREGNANCY, URINE: Preg Test, Ur: NEGATIVE

## 2011-12-17 MED ORDER — DIAZEPAM 5 MG PO TABS: 5.0000 mg | ORAL_TABLET | Freq: Two times a day (BID) | ORAL | Status: AC

## 2011-12-17 MED ORDER — IBUPROFEN 600 MG PO TABS: 600.0000 mg | ORAL_TABLET | Freq: Four times a day (QID) | ORAL | Status: AC | PRN

## 2011-12-17 MED ORDER — IBUPROFEN 400 MG PO TABS
600.0000 mg | ORAL_TABLET | Freq: Once | ORAL | Status: AC
  Administered 2011-12-17: 600 mg via ORAL
  Filled 2011-12-17: qty 1
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
## 2012-01-02 ENCOUNTER — Other Ambulatory Visit: Payer: Self-pay | Admitting: Family Medicine

## 2012-01-02 ENCOUNTER — Telehealth: Payer: Self-pay | Admitting: Family Medicine

## 2012-01-02 MED ORDER — ALBUTEROL SULFATE HFA 108 (90 BASE) MCG/ACT IN AERS: 2.0000 | INHALATION_SPRAY | Freq: Four times a day (QID) | RESPIRATORY_TRACT | Status: DC | PRN

## 2012-01-02 NOTE — Telephone Encounter (Signed)
Pt called back she is at the pharmacy and needs inhaler, she is struggling to breathe.  She will be in here tomorrow for appt.

## 2012-01-02 NOTE — Telephone Encounter (Signed)
Pt is in need of Ventolin Inhaler to [address].  She has appt with you tomorrow.  Please let pt know asap it is called in as she is having a flair up.

## 2012-01-02 NOTE — Telephone Encounter (Signed)
Discussed with Dr. Susann Givens to get approval for Ventolin Inhaler.

## 2012-01-03 ENCOUNTER — Encounter: Payer: Self-pay | Admitting: Family Medicine

## 2012-01-03 ENCOUNTER — Ambulatory Visit (INDEPENDENT_AMBULATORY_CARE_PROVIDER_SITE_OTHER): Payer: BC Managed Care – PPO | Admitting: Family Medicine

## 2012-01-03 VITALS — BP 118/76 | HR 68 | Ht 59.0 in | Wt 141.0 lb

## 2012-01-03 DIAGNOSIS — J45909 Unspecified asthma, uncomplicated: Secondary | ICD-10-CM

## 2012-01-03 DIAGNOSIS — J309 Allergic rhinitis, unspecified: Secondary | ICD-10-CM

## 2012-01-03 MED ORDER — PREDNISONE 20 MG PO TABS: 20.0000 mg | ORAL_TABLET | Freq: Two times a day (BID) | ORAL | Status: AC

## 2012-01-03 MED ORDER — MONTELUKAST SODIUM 10 MG PO TABS
10.0000 mg | ORAL_TABLET | Freq: Every day | ORAL | Status: DC
Start: 2012-01-03 — End: 2012-07-08

## 2012-01-03 MED ORDER — FLUTICASONE PROPIONATE HFA 110 MCG/ACT IN AERO: 2.0000 | INHALATION_SPRAY | Freq: Two times a day (BID) | RESPIRATORY_TRACT | Status: DC

## 2012-01-03 MED ORDER — ALBUTEROL SULFATE HFA 108 (90 BASE) MCG/ACT IN AERS: 2.0000 | INHALATION_SPRAY | Freq: Four times a day (QID) | RESPIRATORY_TRACT | Status: DC | PRN

## 2012-01-03 NOTE — Patient Instructions (Signed)
Restart the Flovent inhaler.  Remember to rinse your mouth out after using. If allergies are worse after stopping nasal spray, start claritin or Zyrtec.  If you like the nasal steroid spray, call and we can send refills to pharmacy.

## 2012-01-03 NOTE — Progress Notes (Signed)
Chief Complaint  Patient presents with  . Asthma    wants to update meds and get refills.   HPI:  Asthma--Asthma has been flaring over the last month, needing to use rescue inhaler about 2-3 times/day.  Allergies have been okay, not flaring.  She wakes up feeling pressure in her chest, and shortness of breath.  During the day, she has shortness of breath with activity or when outside in the eat.  She hasn't been using the Flovent because she was out of refills.  Apparently refills were sent to her pharmacy in May for Flovent, but they apparently gave her flonase.  She has been using the nasal spray, but has been wheezing.  Denies allergy flare currently. She has been compliant with using her singulair daily.  Past Medical History  Diagnosis Date  . Allergy   . Asthma   . GERD (gastroesophageal reflux disease)    History   Social History  . Marital Status: Single    Spouse Name: N/A    Number of Children: N/A  . Years of Education: N/A   Occupational History  . student at [employer]    Social History Main Topics  . Smoking status: Never Smoker   . Smokeless tobacco: Not on file  . Alcohol Use: Yes     taking shots every now and then to sleep.  . Drug Use: No  . Sexually Active: Not on file   Other Topics Concern  . Not on file   Social History Narrative  . No narrative on file   Current Outpatient Prescriptions on File Prior to Visit  Medication Sig Dispense Refill  . albuterol (PROVENTIL HFA;VENTOLIN HFA) 108 (90 BASE) MCG/ACT inhaler Inhale 2 puffs into the lungs every 6 (six) hours as needed.  1 Inhaler  0  . fluticasone (FLOVENT HFA) 110 MCG/ACT inhaler Inhale 2 puffs into the lungs 2 (two) times daily.  1 Inhaler  2  . montelukast (SINGULAIR) 10 MG tablet Take 1 tablet (10 mg total) by mouth at bedtime.  30 tablet  5  . Prenatal Multivit-Min-Fe-FA (PRE-NATAL PO) Take 1 tablet by mouth daily.      Marland Kitchen DISCONTD: albuterol (PROVENTIL,VENTOLIN) 90 MCG/ACT inhaler Inhale 2 puffs  into the lungs every 6 (six) hours as needed.  17 g  0   Allergies  Allergen Reactions  . Omeprazole     REACTION: Rash  . Prilosec (Omeprazole Magnesium)    ROS: Denies fevers, purulent mucus or phlegm, nausea, vomiting, diarrhea, skin rashes or other problems.  PHYSICAL EXAM: BP 118/76  Pulse 68  Ht 4\' 11"  (1.499 m)  Wt 141 lb (63.957 kg)  BMI 28.48 kg/m2  LMP 12/27/2011 Well developed, pleasant female in no distress.  Speaking easily in full sentences, no coughing HEENT:  PERRL, EOMI, conjunctiva clear.  Nasal mucosa moderately edematous with clear mucus.  OP clear.  Sinuses nontender Neck: no lymphadenopathy or mass Heart: regular rate and rhythm without murmur Lungs: clear bilaterally, but diminished air movement.  No wheezes with forced expiration Skin :no rash Psych: normal mood, affect, hygiene and grooming  ASSESSMENT/PLAN: 1. Asthma  fluticasone (FLOVENT HFA) 110 MCG/ACT inhaler, montelukast (SINGULAIR) 10 MG tablet, albuterol (PROVENTIL HFA;VENTOLIN HFA) 108 (90 BASE) MCG/ACT inhaler, predniSONE (DELTASONE) 20 MG tablet  2. RHINITIS, ALLERGIC NOS  montelukast (SINGULAIR) 10 MG tablet   ASTHMA--flare, most likely related to her being off of inhaled steroid.  Will treat with short course of oral steroids, and restart Flovent.  She is  changing pharmacies, so all rx's send to new pharmacy for 6 months.  ALLERGIES--has evidence of allergies on exam.  Never used flonase in past prior to being given in error.  She can stop the nasal steroids, continue Singulair, and use oral antihistamines if needed.  If allergies worsen, can call of flonase prescription  Risks and side effects of all meds reviewed--including steroids, need to rinse after inhaled steroids, etc.

## 2012-01-31 ENCOUNTER — Ambulatory Visit (INDEPENDENT_AMBULATORY_CARE_PROVIDER_SITE_OTHER): Payer: BC Managed Care – PPO | Admitting: Family Medicine

## 2012-01-31 ENCOUNTER — Encounter: Payer: Self-pay | Admitting: Family Medicine

## 2012-01-31 VITALS — BP 112/80 | HR 64 | Temp 98.0°F | Ht 59.0 in | Wt 136.0 lb

## 2012-01-31 DIAGNOSIS — R109 Unspecified abdominal pain: Secondary | ICD-10-CM

## 2012-01-31 NOTE — Progress Notes (Signed)
Chief Complaint  Patient presents with  . Abdominal Pain    upset stomach and cramps since Monday morning. No diarrhea.   HPI: 2 days ago awoke with stomach cramping. She went to the bathroom, and ended up having 4-5 bowel movements just over the morning.  None of them were loose or diarrhea, but was having cramping prior to going to the bathroom.  She had some mild nausea, no vomiting.  Took Pepto Bismol with some relief.  She had frequent stools all day Monday, as well as yesterday and this morning.  Stools have been black only since using Pepto Bismol.  Denies any change in diet.  She has been eating healthier.  Usually she has frequent bowel movement just when on her menstrual cycle, but with last cycle she only had that for one day.  She had mild constipation, some gassiness. She did have some low back pain on Sunday (prior to these symptoms), which she usually gets when she is constipated.    Took some Gas-X as needed. Some belching.  Denies fevers, blood in stool, urinary complaints, fevers.  Husband recently had similar symptoms with stomach cramping, nausea and vomiting, and one day of diarrhea, followed by more frequent stools.  He was sick 1.5 weeks ago.  Since restarting Flovent, hasn't needed rescue inhaler, asthma is improved.  Past Medical History  Diagnosis Date  . Allergy   . Asthma   . GERD (gastroesophageal reflux disease)    No past surgical history on file.   Current Outpatient Prescriptions on File Prior to Visit  Medication Sig Dispense Refill  . albuterol (PROVENTIL HFA;VENTOLIN HFA) 108 (90 BASE) MCG/ACT inhaler Inhale 2 puffs into the lungs every 6 (six) hours as needed.  1 Inhaler  1  . fluticasone (FLOVENT HFA) 110 MCG/ACT inhaler Inhale 2 puffs into the lungs 2 (two) times daily.  1 Inhaler  5  . montelukast (SINGULAIR) 10 MG tablet Take 1 tablet (10 mg total) by mouth at bedtime.  30 tablet  5  . Prenatal Multivit-Min-Fe-FA (PRE-NATAL PO) Take 1 tablet by  mouth daily.      Marland Kitchen DISCONTD: albuterol (PROVENTIL,VENTOLIN) 90 MCG/ACT inhaler Inhale 2 puffs into the lungs every 6 (six) hours as needed.  17 g  0   Allergies  Allergen Reactions  . Omeprazole     REACTION: Rash  . Prilosec (Omeprazole Magnesium)     ROS:  Denies fevers, urinary or URI complaints. No chest pain, shortness of breath.  No skin rashes.  See HPI.    PHYSICAL EXAM: BP 112/80  Pulse 64  Temp 98 F (36.7 C) (Oral)  Ht 4\' 11"  (1.499 m)  Wt 136 lb (61.689 kg)  BMI 27.47 kg/m2  LMP 01/23/2012 Well developed, pleasant female in no distress Neck: no lymphadenopathy or mass Heart: regular rate and rhythm without murmur Lungs: clear bilaterally Abdomen: soft,  Mild tenderness in epigastrium, and R and L outer lower quadrants.  No masses, organomegaly, rebound or guarding.  Normal bowel sounds. Extremities: no edema Skin: no rash  ASSESSMENT/PLAN: 1. Abdominal cramping    Frequent stools with cramping--either is due to constipation, that is self-resolving, or a virus, given her husband's recent similar illness. Avoid dairy for a week.  Probiotics (ie Align).  Drink plenty of fluids.  Expect this to self resolve over the next week.  Return if fevers, bloody stools, worsening abdominal pain.  If true diarrhea develops (watery), you can use imodium as needed.   GERD--allergic to  omeprazole, so trial of ranitidine as needed.  Asthma--improved

## 2012-01-31 NOTE — Patient Instructions (Addendum)
Frequent stools with cramping--either is due to constipation, that is self-resolving, or a virus, given your husband's recent similar illness. Avoid dairy for a week.  Probiotics (ie Align) may help get bowels back to normal.  Drink plenty of fluids.  Expect this to self resolve over the next week.  Return if fevers, bloody stools, worsening abdominal pain.  If true diarrhea develops (watery), you can use imodium as needed.   Reflux/heartburn/belch--since you are allergic to omeprazole,we will avoid all in the same class of medications (PPI), and recommend use of ranitidine as needed (Zantac, or also Pepcid would be fine).

## 2012-04-08 ENCOUNTER — Institutional Professional Consult (permissible substitution): Payer: BC Managed Care – PPO | Admitting: Medical

## 2012-05-03 ENCOUNTER — Ambulatory Visit (INDEPENDENT_AMBULATORY_CARE_PROVIDER_SITE_OTHER): Payer: BC Managed Care – PPO | Admitting: Family Medicine

## 2012-05-03 ENCOUNTER — Encounter: Payer: Self-pay | Admitting: Family Medicine

## 2012-05-03 VITALS — BP 120/80 | HR 80 | Wt 129.0 lb

## 2012-05-03 DIAGNOSIS — R000 Tachycardia, unspecified: Secondary | ICD-10-CM

## 2012-05-03 DIAGNOSIS — J45909 Unspecified asthma, uncomplicated: Secondary | ICD-10-CM

## 2012-05-03 DIAGNOSIS — R55 Syncope and collapse: Secondary | ICD-10-CM

## 2012-05-03 LAB — CBC WITH DIFFERENTIAL/PLATELET
Basophils Absolute: 0 10*3/uL (ref 0.0–0.1)
Basophils Relative: 0 % (ref 0–1)
Eosinophils Relative: 2 % (ref 0–5)
HCT: 37.4 % (ref 36.0–46.0)
Lymphocytes Relative: 39 % (ref 12–46)
MCHC: 34.2 g/dL (ref 30.0–36.0)
MCV: 91.4 fL (ref 78.0–100.0)
Monocytes Absolute: 0.3 10*3/uL (ref 0.1–1.0)
Platelets: 199 10*3/uL (ref 150–400)
RDW: 12 % (ref 11.5–15.5)
WBC: 3.7 10*3/uL — ABNORMAL LOW (ref 4.0–10.5)

## 2012-05-03 LAB — COMPREHENSIVE METABOLIC PANEL
ALT: 10 U/L (ref 0–35)
AST: 13 U/L (ref 0–37)
Alkaline Phosphatase: 52 U/L (ref 39–117)
BUN: 9 mg/dL (ref 6–23)
Chloride: 106 mEq/L (ref 96–112)
Creat: 0.66 mg/dL (ref 0.50–1.10)
Total Bilirubin: 0.4 mg/dL (ref 0.3–1.2)

## 2012-05-03 MED ORDER — FLUTICASONE PROPIONATE HFA 220 MCG/ACT IN AERO: 2.0000 | INHALATION_SPRAY | Freq: Two times a day (BID) | RESPIRATORY_TRACT | Status: DC

## 2012-05-03 NOTE — Progress Notes (Signed)
  Subjective:    Patient ID: Helen Cameron, female    DOB: 1986-01-07, 26 y.o.   MRN: [id number]  HPI She describes several episodes of rapid heart rate and weakness. The very first one occurred when she was in the kitchen and she became syncopal and apparently did pass out. Several of them after that occurred while she was sitting. She notes a heart rate in the mid-100 range. She is under no undue stress. She does take her albuterol rescue inhaler 2 or 3 times per day as well as using her Flovent. She does not smoke. She does state that she does feel anxious and has been losing weight that she initially tried to do but now states she is losing it without trying. She does state that she feels hotter than anybody in her house.   Review of Systems     Objective:   Physical Exam alert and in no distress. Tympanic membranes and canals are normal. Throat is clear. Tonsils are normal. Neck is supple without adenopathy or thyromegaly. Cardiac exam shows a regular sinus rhythm without murmurs or gallops. Lungs are clear to auscultation. DTRs are 3+ EKG is within normal limits       Assessment & Plan:   1. Tachycardia  PR ELECTROCARDIOGRAM, COMPLETE, CBC with Differential, Comprehensive metabolic panel, TSH  2. Syncopal episodes  PR ELECTROCARDIOGRAM, COMPLETE, CBC with Differential, Comprehensive metabolic panel, TSH  3. Asthma  fluticasone (FLOVENT HFA) 220 MCG/ACT inhaler   I will await blood results before further evaluation however some of her symptoms could easily be related to albuterol. Her symptom complex could also be PSVT versus PAF.

## 2012-05-03 NOTE — Patient Instructions (Signed)
Start using the new steroid inhaler now and hopefully that will cut down on the need for albuterol. When you have these attacks count your pulse

## 2012-05-06 NOTE — Progress Notes (Signed)
Quick Note:    Called pt no answer.  ______

## 2012-05-08 NOTE — Progress Notes (Signed)
Quick Note:  Had to mail pt letter because there is no answer on her phone ______

## 2012-05-10 ENCOUNTER — Telehealth: Payer: Self-pay | Admitting: Family Medicine

## 2012-05-10 NOTE — Telephone Encounter (Signed)
Called pt back and left message for pt to call me but pt may want to talk to cheri about her concerns

## 2012-05-17 ENCOUNTER — Telehealth: Payer: Self-pay

## 2012-05-17 NOTE — Telephone Encounter (Signed)
Schedule an appointment either today or beginning of the week or with [facility]

## 2012-05-17 NOTE — Telephone Encounter (Signed)
PT STATES THAT WHEN SHE STOPED INHALER SHE HAS HAD NO MORE RAPID HEART BEAT BUT STILL HAVING ANXIETY SHE NOW HAS A COUGH WITH GREEN AND YELLOW MUCUS WITH LOW GRADE FEVER OF 99.9 AND SHE FEELS WORSE AT NIGHT PLEASE ADVISE

## 2012-05-21 ENCOUNTER — Ambulatory Visit: Payer: BC Managed Care – PPO | Admitting: Family Medicine

## 2012-07-02 ENCOUNTER — Other Ambulatory Visit (INDEPENDENT_AMBULATORY_CARE_PROVIDER_SITE_OTHER): Payer: BC Managed Care – PPO

## 2012-07-02 DIAGNOSIS — Z23 Encounter for immunization: Secondary | ICD-10-CM

## 2012-07-08 ENCOUNTER — Encounter: Payer: Self-pay | Admitting: Family Medicine

## 2012-07-08 ENCOUNTER — Ambulatory Visit (INDEPENDENT_AMBULATORY_CARE_PROVIDER_SITE_OTHER): Payer: BC Managed Care – PPO | Admitting: Family Medicine

## 2012-07-08 VITALS — BP 120/72 | HR 76 | Ht 60.0 in | Wt 122.0 lb

## 2012-07-08 DIAGNOSIS — J309 Allergic rhinitis, unspecified: Secondary | ICD-10-CM

## 2012-07-08 DIAGNOSIS — J45909 Unspecified asthma, uncomplicated: Secondary | ICD-10-CM

## 2012-07-08 DIAGNOSIS — K219 Gastro-esophageal reflux disease without esophagitis: Secondary | ICD-10-CM

## 2012-07-08 DIAGNOSIS — Z0000 Encounter for general adult medical examination without abnormal findings: Secondary | ICD-10-CM

## 2012-07-08 DIAGNOSIS — Z23 Encounter for immunization: Secondary | ICD-10-CM

## 2012-07-08 LAB — POCT URINALYSIS DIPSTICK
Bilirubin, UA: NEGATIVE
Blood, UA: NEGATIVE
Glucose, UA: NEGATIVE
Ketones, UA: NEGATIVE
Spec Grav, UA: 1.025
Urobilinogen, UA: NEGATIVE

## 2012-07-08 MED ORDER — LANSOPRAZOLE 30 MG PO CPDR: 30.0000 mg | DELAYED_RELEASE_CAPSULE | Freq: Every day | ORAL | Status: DC

## 2012-07-08 MED ORDER — ALBUTEROL SULFATE HFA 108 (90 BASE) MCG/ACT IN AERS: 2.0000 | INHALATION_SPRAY | Freq: Four times a day (QID) | RESPIRATORY_TRACT | Status: DC | PRN

## 2012-07-08 MED ORDER — MONTELUKAST SODIUM 10 MG PO TABS: 10.0000 mg | ORAL_TABLET | Freq: Every day | ORAL | Status: AC

## 2012-07-08 NOTE — Progress Notes (Signed)
Chief Complaint  Patient presents with  . Annual Exam    fasting annual exam, no pap-thinks last pap was last summer. Still having abdominal cramps. And still having increased anxiety. Needs refill on albuterol inhaler, thinks rx is expired.   Helen Cameron is a 27 y.o. female who presents for a complete physical.  She is also here for med check, needing refills. She is complaining of abdominal pain and daily reflux (see below).  She saw Dr. Stefano Gaul over the summer for evaluation of infertility.  She reports she had bloodwork done and a pelvic exam, including pap smear (she recalls getting normal letter).  Husband hasn't given sperm sample yet, and they decided just to wait and see for now.  She stopped taking PNV as it is hard to swallow, and was getting frustrated/didn't see the point since she wasn't pregnant.  Asthma:  Doing well overall.  When she was in [place], [place] last week she had some chest tightness.  Realized she was out of inhaler, needs refill.  Other than that, hasn't need to use rescue inhaler since the Flovent dose was increased.  Compliant with Flovent and Singulair.    Allergies: Improved with Singulair.  Some runny nose while at work.  Ears drain at night, yellow (wax).  Has some itching of her ear canals.  GERD:  Flaring recently, daily, with reflux symptoms into her chest.  Worse with pizza, but stomach is "easily upset".  Using zantac 150mg  BID, which helped at first, but no longer working well for her.  Has gotten rash from prilosec in the past. Denies dysphagia.  Yesterday had diarrhea.  Also has been having lower abdominal pain, bilateral, since last week.  Feels like her stomach is being tied in knots, mostly after eating.  Yesterday had pain all day, but also had diarrhea.  Usually pain starts about an hour after eating, and lasts for about an hour.  Bowels are regular, no further constipation.  Dairy does tend to upset her stomach  Health Maintenance: Immunization  History  Administered Date(s) Administered  . Hepatitis A 07/02/2012  . Hpv 11/03/2005, 01/03/2006, 05/10/2006  . Influenza Split 02/22/2011  . Td 04/18/2001  . Tdap 11/09/2006   Last Pap smear: last summer, through Dr. Stefano Gaul Last mammogram: never Last colonoscopy: never Last DEXA: never Dentist: past due Ophtho: July 2013 Exercise: Exercising 3x/week, and active at work Lipids 07/2010 normal (chol 168, LDL 110, HDL 46, TG 59) TSH normal, CBC normal (3.7, Hg 12.8), normal chem  04/2012  Past Medical History  Diagnosis Date  . Allergy   . Asthma   . GERD (gastroesophageal reflux disease)     History reviewed. No pertinent past surgical history.  History   Social History  . Marital Status: Married    Spouse Name: N/A    Number of Children: N/A  . Years of Education: N/A   Occupational History  . student at [employer]   . med tech at [employer]    Social History Main Topics  . Smoking status: Never Smoker   . Smokeless tobacco: Never Used  . Alcohol Use: No     Comment: very rare  . Drug Use: No  . Sexually Active: Yes -- Female partner(s)    Birth Control/ Protection: None   Other Topics Concern  . Not on file   Social History Narrative   Married. Lives with husband.  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  Family History  Problem Relation Age of Onset  . Fibroids Sister   . Asthma Neg Hx   . Heart disease Neg Hx   . Diabetes Neg Hx    Current Outpatient Prescriptions on File Prior to Visit  Medication Sig Dispense Refill  . fluticasone (FLOVENT HFA) 220 MCG/ACT inhaler Inhale 2 puffs into the lungs 2 (two) times daily.  1 Inhaler  12  . montelukast (SINGULAIR) 10 MG tablet Take 1 tablet (10 mg total) by mouth at bedtime.  30 tablet  5  . ranitidine (ZANTAC) 150 MG tablet Take 150 mg by mouth 2 (two) times daily.      Marland Kitchen albuterol (PROVENTIL HFA;VENTOLIN HFA) 108 (90 BASE) MCG/ACT inhaler Inhale 2 puffs  into the lungs every 6 (six) hours as needed.  1 Inhaler  1  . lansoprazole (PREVACID) 30 MG capsule Take 1 capsule (30 mg total) by mouth daily.  30 capsule  2  . Prenatal Multivit-Min-Fe-FA (PRE-NATAL PO) Take 1 tablet by mouth daily.      . [DISCONTINUED] albuterol (PROVENTIL,VENTOLIN) 90 MCG/ACT inhaler Inhale 2 puffs into the lungs every 6 (six) hours as needed.  17 g  0    Allergies  Allergen Reactions  . Omeprazole     REACTION: Rash  . Prilosec (Omeprazole Magnesium)    ROS:  The patient denies anorexia, fever, headaches,  vision changes, decreased hearing, ear pain, sore throat, breast concerns, chest pain, palpitations, dizziness, syncope (none since November), dyspnea on exertion, cough, swelling, nausea, vomiting, melena, hematochezia, hematuria, incontinence, dysuria, irregular menstrual cycles, vaginal discharge, odor or itch, genital lesions, joint pains, numbness, tingling, weakness, tremor, suspicious skin lesions, depression, abnormal bleeding/bruising, or enlarged lymph nodes.  7 pound weight loss since November, 14 since August.  Last 5-7 pound weight loss has not been intentional.  +abdominal pain, diarrhea yesterday. +daily heartburn. J+anxiety--able to calm herself down.  No longer needing to drink alcohol to fall asleep at night.  PHYSICAL EXAM: BP 120/72  Pulse 76  Ht 5' (1.524 m)  Wt 122 lb (55.339 kg)  BMI 23.83 kg/m2  LMP 06/10/2012  General Appearance:    Alert, cooperative, no distress, appears stated age  Head:    Normocephalic, without obvious abnormality, atraumatic  Eyes:    PERRL, conjunctiva/corneas clear, EOM's intact, fundi    benign  Ears:    Normal TM's and external ear canals  Nose:   Nares normal, mucosa normal, no drainage or sinus   tenderness  Throat:   Lips, mucosa, and tongue normal; teeth and gums normal  Neck:   Supple, no lymphadenopathy;  thyroid:  no   enlargement/tenderness/nodules; no carotid   bruit or JVD  Back:    Spine  nontender, no curvature, ROM normal, no CVA     tenderness  Lungs:     Clear to auscultation bilaterally without wheezes, rales or     ronchi; respirations unlabored  Chest Wall:    No tenderness or deformity   Heart:    Regular rate and rhythm, S1 and S2 normal, no murmur, rub   or gallop  Breast Exam:    No tenderness, masses, or nipple discharge or inversion.      No axillary lymphadenopathy  Abdomen:     Soft, nondistended, normoactive bowel sounds,    no masses, no hepatosplenomegaly.  +epigastric tenderness, as well as diffuse lower abdominal tenderness. No rebound tenderness or guarding.  Genitalia:    Deferred (done by GYN)  Rectal:  Not performed due to age<40 and no related complaints  Extremities:   No clubbing, cyanosis or edema  Pulses:   2+ and symmetric all extremities  Skin:   Skin color, texture, turgor normal, no rashes or lesions  Lymph nodes:   Cervical, supraclavicular, and axillary nodes normal  Neurologic:   CNII-XII intact, normal strength, sensation and gait; reflexes 2+ and symmetric throughout          Psych:   Normal mood, affect, hygiene and grooming.    ASSESSMENT/PLAN:  1. Routine general medical examination at a health care facility  Visual acuity screening, POCT Urinalysis Dipstick  2. Asthma  albuterol (PROVENTIL HFA;VENTOLIN HFA) 108 (90 BASE) MCG/ACT inhaler, montelukast (SINGULAIR) 10 MG tablet  3. Need for pneumococcal vaccination  Pneumococcal polysaccharide vaccine 23-valent greater than or equal to 2yo subcutaneous/IM  4. GERD (gastroesophageal reflux disease)  lansoprazole (PREVACID) 30 MG capsule  5. RHINITIS, ALLERGIC NOS  montelukast (SINGULAIR) 10 MG tablet  6. Need for prophylactic vaccination and inoculation against influenza  Flu vaccine greater than or equal to 3yo preservative free IM    Asthma and allergies--well controlled overall.  Continue current meds.  Weight loss--likely related to improved diet, exercise. Normal labs  recently.  GERD--trial of Prevacid 30mg  daily in place of zantac.  Use once daily for 1-2 month, and then try and switch back to Zantac for longterm, if able to.  If not, remain on Prevacid longterm--may be able to switch to lower, OTC dose in future.  Flu shot and pneumovax today. Advised to get flu shots every year, in the fall.  Abdominal pain, with recent diarrhea.  Recommend her to be lactose-free for a week, and gradually re-introduce dairy.  Remain dairy-free if symptoms worsen upon restarting dairy in diet.  Discussed monthly self breast exams and yearly mammograms after the age of 39; at least 30 minutes of aerobic activity at least 5 days/week; proper sunscreen use reviewed; healthy diet, including goals of calcium and vitamin D intake and alcohol recommendations (less than or equal to 1 drink/day) reviewed; regular seatbelt use; changing batteries in smoke detectors.  Immunization recommendations discussed (flu and pneumovax today).  Colonoscopy recommendations reviewed--age 84, sooner prn

## 2012-07-08 NOTE — Patient Instructions (Addendum)
HEALTH MAINTENANCE RECOMMENDATIONS:  It is recommended that you get at least 30 minutes of aerobic exercise at least 5 days/week (for weight loss, you may need as much as 60-90 minutes). This can be any activity that gets your heart rate up. This can be divided in 10-15 minute intervals if needed, but try and build up your endurance at least once a week.  Weight bearing exercise is also recommended twice weekly.  Eat a healthy diet with lots of vegetables, fruits and fiber.  "Colorful" foods have a lot of vitamins (ie green vegetables, tomatoes, red peppers, etc).  Limit sweet tea, regular sodas and alcoholic beverages, all of which has a lot of calories and sugar.  Up to 1 alcoholic drink daily may be beneficial for women (unless trying to lose weight, watch sugars).  Drink a lot of water.  Calcium recommendations are 1200-1500 mg daily (1500 mg for postmenopausal women or women without ovaries), and vitamin D 1000 IU daily.  This should be obtained from diet and/or supplements (vitamins), and calcium should not be taken all at once, but in divided doses.  Monthly self breast exams and yearly mammograms for women over the age of 64 is recommended.  Sunscreen of at least SPF 30 should be used on all sun-exposed parts of the skin when outside between the hours of 10 am and 4 pm (not just when at beach or pool, but even with exercise, golf, tennis, and yard work!)  Use a sunscreen that says "broad spectrum" so it covers both UVA and UVB rays, and make sure to reapply every 1-2 hours.  Remember to change the batteries in your smoke detectors when changing your clock times in the spring and fall.  Use your seat belt every time you are in a car, and please drive safely and not be distracted with cell phones and texting while driving.   Get flu shots every fall.  Abdominal pain and recent diarrhea--go dairy-free for a week, and gradually re-introduce.  Remain dairy-free if symptoms worsen upon  restarting dairy in diet. (lactose-free)  GERD--trial of Prevacid 30mg  daily in place of zantac.  Use once daily for 1-2 month, and then try and switch back to Zantac for longterm, if able to.  If not, remain on Prevacid longterm--may be able to switch to lower, OTC dose in future.  It is possible that you could have allergic reaction to Prevacid, similar to the Prilosec.  Stop immediately if you develop rash or other allergic symptoms.  Schedule dental cleanings (twice a year)  If you are having a hard time swallowing the prenatal vitamins, see if they make Gummi vitamins (prenatal).  If not, can take Gummi multi-vitamin.  Goal is to get 80 mcg (0.8mg ) of folic acid daily when trying for pregnancy.  Return in 1-2 months if ongoing abdominal pain

## 2012-09-25 ENCOUNTER — Encounter: Payer: Self-pay | Admitting: Family Medicine

## 2012-09-25 ENCOUNTER — Telehealth: Payer: Self-pay | Admitting: Family Medicine

## 2012-09-25 ENCOUNTER — Ambulatory Visit (INDEPENDENT_AMBULATORY_CARE_PROVIDER_SITE_OTHER): Payer: BC Managed Care – PPO | Admitting: Family Medicine

## 2012-09-25 VITALS — BP 108/70 | HR 72 | Temp 98.0°F | Ht 59.0 in | Wt 116.0 lb

## 2012-09-25 DIAGNOSIS — J309 Allergic rhinitis, unspecified: Secondary | ICD-10-CM

## 2012-09-25 DIAGNOSIS — R300 Dysuria: Secondary | ICD-10-CM

## 2012-09-25 DIAGNOSIS — N390 Urinary tract infection, site not specified: Secondary | ICD-10-CM

## 2012-09-25 DIAGNOSIS — J45909 Unspecified asthma, uncomplicated: Secondary | ICD-10-CM

## 2012-09-25 DIAGNOSIS — R109 Unspecified abdominal pain: Secondary | ICD-10-CM

## 2012-09-25 LAB — POCT URINALYSIS DIPSTICK
Glucose, UA: NEGATIVE
Protein, UA: NEGATIVE
Spec Grav, UA: 1.015

## 2012-09-25 MED ORDER — LORATADINE 10 MG PO TABS: 10.0000 mg | ORAL_TABLET | Freq: Every day | ORAL | Status: AC

## 2012-09-25 MED ORDER — NITROFURANTOIN MONOHYD MACRO 100 MG PO CAPS: 100.0000 mg | ORAL_CAPSULE | Freq: Two times a day (BID) | ORAL | Status: AC

## 2012-09-25 NOTE — Progress Notes (Signed)
Chief Complaint  Patient presents with  . Abdominal Pain    x 10 days. Yesterday became unbearable. Feels like a "tickle" when she urinates.   She is complaining of lower abdominal pain (suprapubic) x 10 days.  Yesterday had sharp pain, and when lying down, felt like "everything was pushing to upper stomach".  She had increased abdominal pain with voiding yesterday, today is just a tickle/discomfort with voiding (not as severe as yesterday).  Has had UTI in the past where she waited too long and had frank blood in urine.  Denies any hematuria now. Having increased urgency over the last few days, almost had an accident yesterday.  LMP 4/1, not using contraception.  Allergies have been flaring, having sinus pain at right eye.  Using Aleve cold and sinus with some improvement.  Denies discolored mucus or fevers.  No flare of asthma.  GERD--no further abdominal pain since using Prevacid. She has made drastic changes to her diet, mostly eating vegetables and fruits, baked foods.  No longer eating beef, pork, no fried foods.  Past Medical History  Diagnosis Date  . Allergy   . Asthma   . GERD (gastroesophageal reflux disease)    History reviewed. No pertinent past surgical history.  History   Social History  . Marital Status: Married    Spouse Name: N/A    Number of Children: N/A  . Years of Education: N/A   Occupational History  . student at [employer]   . med tech at [employer]    Social History Main Topics  . Smoking status: Never Smoker   . Smokeless tobacco: Never Used  . Alcohol Use: No     Comment: very rare  . Drug Use: No  . Sexually Active: Yes -- Female partner(s)    Birth Control/ Protection: None   Other Topics Concern  . Not on file   Social History Narrative   Married. Lives with husband.  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    Current Outpatient Prescriptions on File Prior to Visit  Medication Sig Dispense Refill   . fluticasone (FLOVENT HFA) 220 MCG/ACT inhaler Inhale 2 puffs into the lungs 2 (two) times daily.  1 Inhaler  12  . lansoprazole (PREVACID) 30 MG capsule Take 1 capsule (30 mg total) by mouth daily.  30 capsule  2  . montelukast (SINGULAIR) 10 MG tablet Take 1 tablet (10 mg total) by mouth at bedtime.  30 tablet  5  . Prenatal Multivit-Min-Fe-FA (PRE-NATAL PO) Take 1 tablet by mouth daily.      Marland Kitchen albuterol (PROVENTIL HFA;VENTOLIN HFA) 108 (90 BASE) MCG/ACT inhaler Inhale 2 puffs into the lungs every 6 (six) hours as needed.  1 Inhaler  1  . [DISCONTINUED] albuterol (PROVENTIL,VENTOLIN) 90 MCG/ACT inhaler Inhale 2 puffs into the lungs every 6 (six) hours as needed.  17 g  0   No current facility-administered medications on file prior to visit.   Allergies  Allergen Reactions  . Omeprazole     REACTION: Rash  . Prilosec (Omeprazole Magnesium)    ROS:  Denies fevers, nausea, vomiting, diarrhea, back pain.  +allergies/sinus pain.  GERD symptoms resolved.  Denies rashes, headaches (sinus only), chest pain, palpitations, shortness of breath or wheezing (asthma stable--rarely needs albuterol).  PHYSICAL EXAM: BP 108/70  Pulse 72  Temp(Src) 98 F (36.7 C) (Oral)  Ht 4\' 11"  (1.499 m)  Wt 116 lb (52.617 kg)  BMI 23.42 kg/m2  LMP 09/03/2012 Well developed, pleasant female in no distress HEENT:  Conjunctiva clear.  OP clear.  Sinuses nontender Neck: no lymphadenopathy Heart: regular rate and rhythm without murmur Lungs: clear bilaterally, no wheezes Back: no CVA tenderness Abdomen: soft.  Mild epigastric tenderness.  +suprapubic tenderness, with some mild tenderness across entire lower abdomen, most tender in center.  No mass, rebound tenderness or guarding Extremities: no edema Skin: no rash   Urine dip: trace leuks, 2+ ketones, otherwise normal Urine pregnancy: negative  ASSESSMENT/PLAN: Abdominal pain, unspecified site - Plan: POCT Urinalysis Dipstick, POCT Pregnancy, Urine, POCT  Pregnancy, Urine, POCT Pregnancy, Urine, POCT Pregnancy, Urine, POCT urine pregnancy, CANCELED: Pregnancy, urine  RHINITIS, ALLERGIC NOS - some flare with springtime allergies starting  Dysuria - Plan: Urine culture  Urinary tract infection, site not specified - Plan: nitrofurantoin, macrocrystal-monohydrate, (MACROBID) 100 MG capsule  Asthma - stable  Drink plenty of fluids.  Start the antibiotic today (urine didn't look too abnormal, but your symptoms are classic for urinary infection.  Start antibiotic while awaiting urine culture). Your symptoms should improve in 24-48 hours.  Call Monday if not better, to check on culture results.  Call sooner (ie Friday) if worse--fevers, flank pain.  You may add an antihistamine to help with allergies (ie zyrtec or claritin).  You can continue to use Aleve cold and sinus with this (versus stopping the aleve and using a combination product such as claritin-D or Zyrtec-D).

## 2012-09-25 NOTE — Patient Instructions (Signed)
Drink plenty of fluids.  Start the antibiotic today (urine didn't look too abnormal, but your symptoms are classic for urinary infection.  Start antibiotic while awaiting urine culture). Your symptoms should improve in 24-48 hours.  Call Monday if not better, to check on culture results.  Call sooner (ie Friday) if worse--fevers, flank pain.  You may add an antihistamine to help with allergies (ie zyrtec or claritin).  You can continue to use Aleve cold and sinus with this (versus stopping the aleve and using a combination product such as claritin-D or Zyrtec-D).

## 2012-09-25 NOTE — Telephone Encounter (Signed)
Left message informing her that claritin was sent and that she can use aleve cold and sinus along with it as needed for sinus pain.

## 2012-09-25 NOTE — Telephone Encounter (Signed)
Done.  Advise pt that I sent for claritin (plain).  She can continue to use the aleve cold and sinus along with it, if needed for sinus pain.

## 2012-09-25 NOTE — Telephone Encounter (Signed)
Patient called needs Rx for Zyrtec or Claritin, whichever one you prefer her to use, sent to her pharmacy. Patient states she has to have order for medication in order to  Use her benefits card     [facility] [address]

## 2012-09-27 LAB — URINE CULTURE: Colony Count: 8000

## 2012-11-11 ENCOUNTER — Telehealth: Payer: Self-pay | Admitting: Family Medicine

## 2012-11-11 DIAGNOSIS — K219 Gastro-esophageal reflux disease without esophagitis: Secondary | ICD-10-CM

## 2012-11-11 MED ORDER — ALBUTEROL SULFATE HFA 108 (90 BASE) MCG/ACT IN AERS: 2.0000 | INHALATION_SPRAY | Freq: Four times a day (QID) | RESPIRATORY_TRACT | Status: AC | PRN

## 2012-11-11 MED ORDER — LANSOPRAZOLE 30 MG PO CPDR: 30.0000 mg | DELAYED_RELEASE_CAPSULE | Freq: Every day | ORAL | Status: DC

## 2012-11-11 NOTE — Telephone Encounter (Signed)
Ok to refill through her appt in August

## 2012-11-11 NOTE — Telephone Encounter (Signed)
Done

## 2012-12-21 ENCOUNTER — Encounter (HOSPITAL_COMMUNITY): Payer: Self-pay | Admitting: Emergency Medicine

## 2012-12-21 ENCOUNTER — Emergency Department (INDEPENDENT_AMBULATORY_CARE_PROVIDER_SITE_OTHER)
Admission: EM | Admit: 2012-12-21 | Discharge: 2012-12-21 | Disposition: A | Discharge status: Home / Self Care | Payer: BC Managed Care – PPO | Source: Home / Self Care | Attending: Emergency Medicine | Admitting: Emergency Medicine

## 2012-12-21 DIAGNOSIS — J040 Acute laryngitis: Secondary | ICD-10-CM

## 2012-12-21 DIAGNOSIS — J45909 Unspecified asthma, uncomplicated: Secondary | ICD-10-CM

## 2012-12-21 DIAGNOSIS — J4520 Mild intermittent asthma, uncomplicated: Secondary | ICD-10-CM

## 2012-12-21 LAB — POCT RAPID STREP A: Streptococcus, Group A Screen (Direct): NEGATIVE

## 2012-12-21 MED ORDER — CHLORPHENIRAMINE MALEATE ER 12 MG PO TBCR: 1.0000 | EXTENDED_RELEASE_TABLET | Freq: Two times a day (BID) | ORAL | Status: AC

## 2012-12-21 MED ORDER — BENZONATATE 200 MG PO CAPS: 200.0000 mg | ORAL_CAPSULE | Freq: Three times a day (TID) | ORAL | Status: AC | PRN

## 2012-12-21 MED ORDER — PREDNISONE 20 MG PO TABS: 20.0000 mg | ORAL_TABLET | Freq: Two times a day (BID) | ORAL | Status: AC

## 2012-12-21 MED ORDER — METHYLPREDNISOLONE ACETATE 80 MG/ML IJ SUSP
INTRAMUSCULAR | Status: AC
  Filled 2012-12-21: qty 1

## 2012-12-21 MED ORDER — METHYLPREDNISOLONE ACETATE 80 MG/ML IJ SUSP
80.0000 mg | Freq: Once | INTRAMUSCULAR | Status: AC
  Administered 2012-12-21: 80 mg via INTRAMUSCULAR

## 2012-12-21 NOTE — Discharge Instructions (Signed)
Asthma Prevention Cigarette smoke, house dust, molds, pollens, animal dander, certain insects, exercise, and even cold air are all triggers that can cause an asthma attack. Often, no specific triggers are identified.  Take the following measures around your house to reduce attacks:  Avoid cigarette and other smoke. No smoking should be allowed in a home where someone with asthma lives. If smoking is allowed indoors, it should be done in a room with a closed door, and a window should be opened to clear the air. If possible, do not use a wood-burning stove, kerosene heater, or fireplace. Minimize exposure to all sources of smoke, including incense, candles, fires, and fireworks.  Decrease pollen exposure. Keep your windows shut and use central air during the pollen allergy season. Stay indoors with windows closed from late morning to afternoon, if you can. Avoid mowing the lawn if you have grass pollen allergy. Change your clothes and shower after being outside during this time of year.  Remove molds from bathrooms and wet areas. Do this by cleaning the floors with a fungicide or diluted bleach. Avoid using humidifiers, vaporizers, or swamp coolers. These can spread molds through the air. Fix leaky faucets, pipes, or other sources of water that have mold around them.  Decrease house dust exposure. Do this by using bare floors, vacuuming frequently, and changing furnace and air cooler filters frequently. Avoid using feather, wool, or foam bedding. Use polyester pillows and plastic covers over your mattress. Wash bedding weekly in hot water (hotter than 130 F).  Try to get someone else to vacuum for you once or twice a week, if you can. Stay out of rooms while they are being vacuumed and for a short while afterward. If you vacuum, use a dust mask (from a hardware store), a double-layered or microfilter vacuum cleaner bag, or a vacuum cleaner with a HEPA filter.  Avoid perfumes, talcum powder, hair spray,  paints and other strong odors and fumes.  Keep warm-blooded pets (cats, dogs, rodents, birds) outside the home if they are triggers for asthma. If you can't keep the pet outdoors, keep the pet out of your bedroom and other sleeping areas at all times, and keep the door closed. Remove carpets and furniture covered with cloth from your home. If that is not possible, keep the pet away from fabric-covered furniture and carpets.  Eliminate cockroaches. Keep food and garbage in closed containers. Never leave food out. Use poison baits, traps, powders, gels, or paste (for example, boric acid). If a spray is used to kill cockroaches, stay out of the room until the odor goes away.  Decrease indoor humidity to less than 60%. Use an indoor air cleaning device.  Avoid sulfites in foods and beverages. Do not drink beer or wine or eat dried fruit, processed potatoes, or shrimp if they cause asthma symptoms.  Avoid cold air. Cover your nose and mouth with a scarf on cold or windy days.  Avoid aspirin. This is the most common drug causing serious asthma attacks.  If exercise triggers your asthma, ask your caregiver how you should prepare before exercising. (For example, ask if you could use your inhaler 10 minutes before exercising.)  Avoid close contact with people who have a cold or the flu since your asthma symptoms may get worse if you catch the infection from them. Wash your hands thoroughly after touching items that may have been handled by others with a respiratory infection.  Get a flu shot every year to protect against the  flu virus, which often makes asthma worse for days to weeks. Also get a pneumonia shot once every five to 10 years. Call your caregiver if you want further information about measures you can take to help prevent asthma attacks. Document Released: 05/22/2005 Document Revised: 08/14/2011 Document Reviewed: 03/30/2009 [facility] Patient Information 2014 [place], [place].  Laryngitis At  the top of your windpipe is your voice box. It is the source of your voice. Inside your voice box are 2 bands of muscles called vocal cords. When you breathe, your vocal cords are relaxed and open so that air can get into the lungs. When you decide to say something, these cords come together and vibrate. The sound from these vibrations goes into your throat and comes out through your mouth as sound. Laryngitis is an inflammation of the vocal cords that causes hoarseness, cough, loss of voice, sore throat, and dry throat. Laryngitis can be temporary (acute) or long-term (chronic). Most cases of acute laryngitis improve with time.Chronic laryngitis lasts for more than 3 weeks. CAUSES Laryngitis can often be related to excessive smoking, talking, or yelling, as well as inhalation of toxic fumes and allergies. Acute laryngitis is usually caused by a viral infection, vocal strain, measles or mumps, or bacterial infections. Chronic laryngitis is usually caused by vocal cord strain, vocal cord injury, postnasal drip, growths on the vocal cords, or acid reflux. SYMPTOMS   Cough.  Sore throat.  Dry throat. RISK FACTORS  Respiratory infections.  Exposure to irritating substances, such as cigarette smoke, excessive amounts of alcohol, stomach acids, and workplace chemicals.  Voice trauma, such as vocal cord injury from shouting or speaking too loud. DIAGNOSIS  Your cargiver will perform a physical exam. During the physical exam, your caregiver will examine your throat. The most common sign of laryngitis is hoarseness. Laryngoscopy may be necessary to confirm the diagnosis of this condition. This procedure allows your caregiver to look into the larynx. HOME CARE INSTRUCTIONS  Drink enough fluids to keep your urine clear or pale yellow.  Rest until you no longer have symptoms or as directed by your caregiver.  Breathe in moist air.  Take all medicine as directed by your caregiver.  Do not  smoke.  Talk as little as possible (this includes whispering).  Write on paper instead of talking until your voice is back to normal.  Follow up with your caregiver if your condition has not improved after 10 days. SEEK MEDICAL CARE IF:   You have trouble breathing.  You cough up blood.  You have persistent fever.  You have increasing pain.  You have difficulty swallowing. MAKE SURE YOU:  Understand these instructions.  Will watch your condition.  Will get help right away if you are not doing well or get worse. Document Released: 05/22/2005 Document Revised: 08/14/2011 Document Reviewed: 07/28/2010 [facility] Patient Information 2014 [place], [place].  Most upper respiratory infections are caused by viruses and do not require antibiotics.  We try to save the antibiotics for when we really need them to avoid resistance.  This does not mean that there is nothing that can be done.  Here are a few hints about things that can be done at home to get over an upper respiratory infection quicker:  Get extra sleep and extra fluids.  Get 7 to 9 hours of sleep per night and 6 to 8 glasses of water a day.  Getting extra sleep keeps the immune system from getting run down.  Most people with an upper respiratory  infection are a little dehydrated.  The extra fluids also keep the secretions liquified and easier to deal with.  Also, get extra vitamin C.  4000 mg per day is the recommended dose. For the aches, headache, and fever, acetaminophen or ibuprofen are helpful.  These can be alternated every 4 hours.  People with liver disease should avoid large amounts of acetaminophen, and people with ulcer disease, gastroesophageal reflux, gastritis, congestive heart failure, chronic kidney disease, coronary artery disease and the elderly should avoid ibuprofen. For nasal congestion try Mucinex-D, or if you're having lots of sneezing or copious clear nasal drainage Allegra-D-24 hour.  A Saline nasal spray such  as Ocean Spray can also help as can decongestant sprays such as Afrin, but you should not use the decongestant sprays for more than 3 or 4 days since they can be habituating.  If nasal dryness is a problem, Ayr Nasal Gel can help moisturize your nasal passages.  Breath Rite nasal strips can also offer a non-drug alternative treatment to nasal congestion, especially at night. For people with symptoms of sinusitis, sleeping with your head elevated can be helpful.  For sinus pain, moist, hot compresses to the face may provide some relief.  Many people find that inhaling steam as in a shower or from a pot of steaming water can help. For sore throat, zinc containing lozenges such as Cold-Eze or Zicam are helpful.  Zinc helps to fight infection and has a mild astringent effect that relieves the sore, achey throat.  Hot salt water gargles (8 oz of hot water, 1/2 tsp of table salt, and a pinch of baking soda) can give relief as well as hot beverages such as hot tea. For the cough, old time remedies such as honey or honey and lemon are tried and true.  Over the counter cough syrups such as Delsym 2 tsp every 12 hours can help as well.  It has also been found recently that Aleve can help control a cough.  The dose is 1 to 2 tablets twice daily with food.  This can be combined with Delsym. (Note, if you are taking ibuprofen, you should not take Aleve as well--take one or the other.)  It's important when you have an upper respiratory infection not to pass the infection to others.  This involves being very careful about the following:  Frequent hand washing or use of hand sanitizer, especially after coughing, sneezing, blowing your nose or touching your face, nose or eyes. Do not shake hands or touch anyone and try to avoid touching surfaces that other people use such as doorknobs, shopping carts, telephones and computer keyboards. Use tissues and dispose of them properly in a garbage can or ziplock bag. Cough into  your sleeve. Do not let others eat or drink after you.  It's also important to recognize the signs of serious illness and get evaluated if they occur: Any respiratory infection that lasts more than 7 to 10 days.  Yellow nasal drainage and sputum are not reliable indicators of a bacterial infection, but if they last for more than 1 week, see your doctor. Fever and sore throat can indicate strep. Fever and cough can indicate influenza or pneumonia. Any kind of severe symptom such as difficulty breathing, intractable vomiting, or severe pain should prompt you to see a doctor as soon as possible.   Your body's immune system is really the thing that will get rid of this infection.  Your immune system is comprised of 2 types  of specialized cells called T cells and B cells.  T cells coordinate the array of cells in your body that engulf invading bacteria or viruses while B cells orchestrate the production of antibodies that neutralize infection.  Anything we do or any medications we give you, will just strengthen your immune system or help it clear up the infection quicker.  Here are a few helpful hints to improve your immune system to help overcome this illness or to prevent future infections:  A few vitamins can improve the health of your immune system.  That's why your diet should include plenty of fruits, vegetables, fish, nuts, and whole grains.  Vitamin A and bet-carotene can increase the cells that fight infections (T cells and B cells).  Vitamin A is abundant in dark greens and orange vegetables such as spinach, greens, sweet potatoes, and carrots.  Vitamin B6 contributes to the maturation of white blood cells, the cells that fight disease.  Foods with vitamin B6 include cold cereal and bananas.  Vitamin C is credited with preventing colds because it increases white blood cells and also prevents cellular damage.  Citrus fruits, peaches and green and red bell peppers are all hight in vitamin  C.  Vitamin E is an anti-oxidant that encourages the production of natural killer cells which reject foreign invaders and B cells that produce antibodies.  Foods high in vitamin E include wheat germ, nuts and seeds.  Foods high in omega-3 fatty acids found in foods like salmon, tuna and mackerel boost your immune system and help cells to engulf and absorb germs.  Probiotics are good bacteria that increase your T cells.  These can be found in yogurt and are available in supplements such as Culturelle or Align.  Moderate exercise increases the strength of your immune system and your ability to recover from illness.  I suggest 3 to 5 moderate intensity 30 minute workouts per week.    Sleep is another component of maintaining a strong immune system.  It enables your body to recuperate from the day's activities, stress and work.  My recommendation is to get between 7 and 9 hours of sleep per night.  If you smoke, try to quit completely or at least cut down.  Drink alcohol only in moderation if at all.  No more than 2 drinks daily for men or 1 for women.  Get a flu vaccine early in the fall or if you have not gotten one yet, once this illness has run its course.  If you are over 65, a smoker, or an asthmatic, get a pneumococcal vaccine.  My final recommendation is to maintain a healthy weight.  Excess weight can impair the immune system by interfering with the way the immune system deals with invading viruses or bacteria.

## 2012-12-21 NOTE — ED Provider Notes (Signed)
Chief Complaint:   Chief Complaint  Patient presents with  . Sore Throat    since tuesday. loss of voice. gradually getting worse.     History of Present Illness:   Helen Cameron is a 27 year old female who has had a five-day history of sore throat, hoarseness, has lost her voice, chills, sweats, headache, nasal congestion, rhinorrhea, dry cough, and some wheezing. She has a history of asthma and allergies. She's been using her Flovent inhaler and albuterol inhaler. She's not wheezing right now.  Review of Systems:  Other than noted above, the patient denies any of the following symptoms: Systemic:  No fevers, chills, sweats, weight loss or gain, fatigue, or tiredness. Eye:  No redness or discharge. ENT:  No ear pain, drainage, headache, nasal congestion, drainage, sinus pressure, difficulty swallowing, or sore throat. Neck:  No neck pain or swollen glands. Lungs:  No cough, sputum production, hemoptysis, wheezing, chest tightness, shortness of breath or chest pain. GI:  No abdominal pain, nausea, vomiting or diarrhea.  PMFSH:  Past medical history, family history, social history, meds, and allergies were reviewed. She is allergic to Prilosec. She has asthma and reflux esophagitis. She takes Flovent, albuterol, Claritin, and Prevacid.  Physical Exam:   Vital signs:  BP 125/93  Pulse 81  Temp(Src) 98 F (36.7 C) (Oral)  Resp 16  SpO2 100%  LMP 12/17/2012 General:  Alert and oriented.  In no distress.  Skin warm and dry. She is hoarse and cannot speak above a whisper. Eye:  No conjunctival injection or drainage. Lids were normal. ENT:  TMs and canals were normal, without erythema or inflammation.  Nasal mucosa was clear and uncongested, without drainage.  Mucous membranes were moist.  Pharynx was clear with no exudate or drainage.  There were no oral ulcerations or lesions. Neck:  Supple, no adenopathy, tenderness or mass. Lungs:  No respiratory distress.  Lungs were clear to  auscultation, without wheezes, rales or rhonchi.  Breath sounds were clear and equal bilaterally.  Heart:  Regular rhythm, without gallops, murmers or rubs. Skin:  Clear, warm, and dry, without rash or lesions.  Labs:   Results for orders placed during the hospital encounter of 12/21/12  POCT RAPID STREP A ([facility] ONLY)      Result Value Range   Streptococcus, Group A Screen (Direct) NEGATIVE  NEGATIVE    Course in Urgent Care Center:   Given Depo-Medrol 80 mg IM.  Assessment:  The primary encounter diagnosis was Laryngitis. A diagnosis of Asthma, mild intermittent, uncomplicated was also pertinent to this visit.  For right now her asthma does not seem to be a problem. We'll go ahead and treat with a course of prednisone to prevent asthma flareups and also to treat her laryngitis.  Plan:   1.  The following meds were prescribed:   Discharge Medication List as of 12/21/2012  2:06 PM    START taking these medications   Details  benzonatate (TESSALON) 200 MG capsule Take 1 capsule (200 mg total) by mouth 3 (three) times daily as needed for cough., Starting 12/21/2012, Until Discontinued, Normal    Chlorpheniramine Maleate 12 MG TBCR Take 1 tablet (12 mg total) by mouth 2 (two) times daily., Starting 12/21/2012, Until Discontinued, Normal    predniSONE (DELTASONE) 20 MG tablet Take 1 tablet (20 mg total) by mouth 2 (two) times daily., Starting 12/21/2012, Until Discontinued, Normal       2.  The patient was instructed in symptomatic care and  handouts were given. 3.  The patient was told to return if becoming worse in any way, if no better in 3 or 4 days, and given some red flag symptoms such as difficulty breathing or high fever that would indicate earlier return. 4.  Follow up here if necessary.      Reuben Likes, MD 12/21/12 [phone]

## 2012-12-21 NOTE — ED Notes (Signed)
Reports sore throat that started on Tuesday and has gradually gotten worse with loss of voice. HA. Dry nonproductive cough.  PT has used otc meds with no relief in symptoms.

## 2012-12-24 LAB — CULTURE, GROUP A STREP

## 2012-12-25 ENCOUNTER — Telehealth (HOSPITAL_COMMUNITY): Payer: Self-pay | Admitting: *Deleted

## 2012-12-25 ENCOUNTER — Telehealth (HOSPITAL_COMMUNITY): Payer: Self-pay | Admitting: Emergency Medicine

## 2012-12-25 MED ORDER — AMOXICILLIN 500 MG PO CAPS: 500.0000 mg | ORAL_CAPSULE | Freq: Three times a day (TID) | ORAL | Status: AC

## 2012-12-25 NOTE — ED Notes (Signed)
I called pt. Pt. verified x 2 and given results.  Pt. Told she has a Rx. of Amoxicillin 500 mg., at the [facility] on [address] and how to take it.  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 She requested a work note. I told her I would leave a note at the front desk for her .  Discussed with Dr. Lorenz Coaster and he said she can go back to work on Friday. Note done as directed. Helen Cameron 12/25/2012

## 2012-12-25 NOTE — ED Notes (Signed)
Culture was positive for non-group A strep. We'll go ahead and treat with amoxicillin 500 mg 3 times daily for 10 days.  Reuben Likes, MD 12/25/12 [phone]

## 2012-12-25 NOTE — Telephone Encounter (Signed)
Message copied by Reuben Likes on Wed Dec 25, 2012  7:55 AM ------      Message from: Vassie Moselle      Created: Tue Dec 24, 2012 10:18 PM      Regarding: lab       Throat culture: Strep beta hemolytic, not group A.  Did not see an antibiotic ordered. Do we treat this?      Vassie Moselle      12/24/2012       ------

## 2013-01-06 ENCOUNTER — Encounter: Payer: BC Managed Care – PPO | Admitting: Family Medicine

## 2013-01-06 ENCOUNTER — Encounter: Payer: Self-pay | Admitting: Family Medicine

## 2013-03-17 ENCOUNTER — Other Ambulatory Visit: Payer: Self-pay | Admitting: Family Medicine

## 2013-03-28 ENCOUNTER — Other Ambulatory Visit: Payer: Self-pay | Admitting: Family Medicine

## 2013-04-04 ENCOUNTER — Ambulatory Visit: Payer: Self-pay

## 2013-04-04 ENCOUNTER — Other Ambulatory Visit: Payer: Self-pay | Admitting: Occupational Medicine

## 2013-04-04 DIAGNOSIS — Z9289 Personal history of other medical treatment: Secondary | ICD-10-CM

## 2013-05-13 ENCOUNTER — Other Ambulatory Visit: Payer: Self-pay | Admitting: Family Medicine

## 2019-09-07 ENCOUNTER — Encounter (HOSPITAL_COMMUNITY): Payer: Self-pay | Admitting: Emergency Medicine

## 2019-09-07 ENCOUNTER — Emergency Department (HOSPITAL_COMMUNITY)
Admission: EM | Admit: 2019-09-07 | Discharge: 2019-09-07 | Disposition: A | Discharge status: Home / Self Care | Payer: No Typology Code available for payment source | Attending: Emergency Medicine | Admitting: Emergency Medicine

## 2019-09-07 ENCOUNTER — Emergency Department (HOSPITAL_COMMUNITY): Payer: No Typology Code available for payment source

## 2019-09-07 ENCOUNTER — Other Ambulatory Visit: Payer: Self-pay

## 2019-09-07 ENCOUNTER — Ambulatory Visit (HOSPITAL_COMMUNITY)
Admission: EM | Admit: 2019-09-07 | Discharge: 2019-09-07 | Disposition: A | Discharge status: Home / Self Care | Payer: Self-pay

## 2019-09-07 DIAGNOSIS — S060X1A Concussion with loss of consciousness of 30 minutes or less, initial encounter: Secondary | ICD-10-CM | POA: Diagnosis not present

## 2019-09-07 DIAGNOSIS — J45909 Unspecified asthma, uncomplicated: Secondary | ICD-10-CM | POA: Diagnosis not present

## 2019-09-07 DIAGNOSIS — Y92410 Unspecified street and highway as the place of occurrence of the external cause: Secondary | ICD-10-CM | POA: Insufficient documentation

## 2019-09-07 DIAGNOSIS — Y999 Unspecified external cause status: Secondary | ICD-10-CM | POA: Diagnosis not present

## 2019-09-07 DIAGNOSIS — Y9389 Activity, other specified: Secondary | ICD-10-CM | POA: Diagnosis not present

## 2019-09-07 DIAGNOSIS — M25552 Pain in left hip: Secondary | ICD-10-CM | POA: Insufficient documentation

## 2019-09-07 DIAGNOSIS — Z79899 Other long term (current) drug therapy: Secondary | ICD-10-CM | POA: Insufficient documentation

## 2019-09-07 DIAGNOSIS — T07XXXA Unspecified multiple injuries, initial encounter: Secondary | ICD-10-CM

## 2019-09-07 DIAGNOSIS — R102 Pelvic and perineal pain: Secondary | ICD-10-CM | POA: Diagnosis not present

## 2019-09-07 DIAGNOSIS — M542 Cervicalgia: Secondary | ICD-10-CM | POA: Insufficient documentation

## 2019-09-07 DIAGNOSIS — T148XXA Other injury of unspecified body region, initial encounter: Secondary | ICD-10-CM | POA: Insufficient documentation

## 2019-09-07 DIAGNOSIS — S0990XA Unspecified injury of head, initial encounter: Secondary | ICD-10-CM | POA: Diagnosis present

## 2019-09-07 HISTORY — DX: Thyrotoxicosis with diffuse goiter without thyrotoxic crisis or storm: E05.00

## 2019-09-07 MED ORDER — HYDROCODONE-ACETAMINOPHEN 5-325 MG PO TABS
1.0000 | ORAL_TABLET | Freq: Once | ORAL | Status: AC
  Administered 2019-09-07: 20:00:00 1 via ORAL
  Filled 2019-09-07: qty 1

## 2019-09-07 MED ORDER — ACETAMINOPHEN 325 MG PO TABS
650.0000 mg | ORAL_TABLET | Freq: Once | ORAL | Status: AC
  Administered 2019-09-07: 22:00:00 650 mg via ORAL
  Filled 2019-09-07: qty 2

## 2019-09-07 MED ORDER — IBUPROFEN 400 MG PO TABS: 400.0000 mg | ORAL_TABLET | Freq: Four times a day (QID) | ORAL | 0 refills | Status: AC

## 2019-09-07 MED ORDER — METHOCARBAMOL 500 MG PO TABS: 500.0000 mg | ORAL_TABLET | Freq: Two times a day (BID) | ORAL | 0 refills | Status: AC
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
## 2019-09-07 NOTE — ED Notes (Signed)
Patient verbalizes understanding of discharge instructions. Opportunity for questioning and answers were provided. Armband removed by staff, pt discharged from ED. Pt. ambulatory and discharged home.  
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
